# Patient Record
Sex: Male | Born: 1970
Health system: Southern US, Community
[De-identification: ages and names within clinical notes are randomized; demographics above are authoritative.]

## PROBLEM LIST (undated history)

## (undated) DIAGNOSIS — N201 Calculus of ureter: Secondary | ICD-10-CM

## (undated) DIAGNOSIS — G4733 Obstructive sleep apnea (adult) (pediatric): Secondary | ICD-10-CM

## (undated) DIAGNOSIS — N529 Male erectile dysfunction, unspecified: Secondary | ICD-10-CM

## (undated) DIAGNOSIS — N281 Cyst of kidney, acquired: Secondary | ICD-10-CM

## (undated) DIAGNOSIS — Z6835 Body mass index (BMI) 35.0-35.9, adult: Secondary | ICD-10-CM

## (undated) DIAGNOSIS — I10 Essential (primary) hypertension: Secondary | ICD-10-CM

## (undated) DIAGNOSIS — J449 Chronic obstructive pulmonary disease, unspecified: Secondary | ICD-10-CM

## (undated) HISTORY — DX: Body mass index (BMI) 35.0-35.9, adult: Z68.35

## (undated) HISTORY — DX: Essential (primary) hypertension: I10

## (undated) HISTORY — DX: Chronic obstructive pulmonary disease, unspecified: J44.9

## (undated) HISTORY — DX: Cyst of kidney, acquired: N28.1

---

## 2008-08-13 DIAGNOSIS — N281 Cyst of kidney, acquired: Secondary | ICD-10-CM

## 2008-08-13 HISTORY — DX: Cyst of kidney, acquired: N28.1

## 2009-04-21 ENCOUNTER — Ambulatory Visit: Payer: Self-pay | Admitting: Urology

## 2009-08-13 HISTORY — PX: UVULOPALATOPHARYNGOPLASTY: SHX827

## 2009-09-02 ENCOUNTER — Encounter: Payer: Self-pay | Admitting: Internal Medicine

## 2009-09-14 ENCOUNTER — Ambulatory Visit: Payer: Self-pay | Admitting: Internal Medicine

## 2009-09-14 DIAGNOSIS — J479 Bronchiectasis, uncomplicated: Secondary | ICD-10-CM | POA: Insufficient documentation

## 2009-09-14 DIAGNOSIS — I10 Essential (primary) hypertension: Secondary | ICD-10-CM

## 2009-09-14 HISTORY — DX: Essential (primary) hypertension: I10

## 2009-09-14 HISTORY — DX: Bronchiectasis, uncomplicated: J47.9

## 2009-09-19 ENCOUNTER — Telehealth (INDEPENDENT_AMBULATORY_CARE_PROVIDER_SITE_OTHER): Payer: Self-pay | Admitting: *Deleted

## 2009-09-20 LAB — CONVERTED CEMR LAB
Basophils Absolute: 0.1 10*3/uL (ref 0.0–0.1)
Eosinophils Absolute: 0.1 10*3/uL (ref 0.0–0.7)
Eosinophils Relative: 1.1 % (ref 0.0–5.0)
HCT: 44 % (ref 39.0–52.0)
Lymphs Abs: 1.3 10*3/uL (ref 0.7–4.0)
MCHC: 35 g/dL (ref 30.0–36.0)
MCV: 87.7 fL (ref 78.0–100.0)
Monocytes Absolute: 0.8 10*3/uL (ref 0.1–1.0)
Platelets: 235 10*3/uL (ref 150.0–400.0)
Pro B Natriuretic peptide (BNP): 6 pg/mL (ref 0.0–100.0)
RDW: 11.8 % (ref 11.5–14.6)

## 2010-09-12 NOTE — Progress Notes (Signed)
Summary: pt returned call re: labs  Phone Note Call from Patient   Caller: Patient Call For: wert Summary of Call: pt returned call from leslie. pt is aware that his labs were normal and no change in  therapy was needed. nothing else needed at this time per pt.  Initial call taken by: Tivis Ringer, CNA,  September 19, 2009 2:56 PM

## 2010-09-12 NOTE — Assessment & Plan Note (Signed)
Summary: Pulmonary consultation/ bronchiectasis with cough/sob    Visit Type:  Initial Consult Copy to:  Dr. Joetta Manners Primary Provider/Referring Provider:  Dr. Joetta Manners  CC:  Bronchiectasis.  History of Present Illness: 83 yowm never smoker with chronic sneezing every am x around 1995 not association with sinus infection.   September 14, 2009 cc new onset doe since 04/2009 progressively worse to point where across a parking lot some worse cough not very productive with choking / gasping/ gagging and vomiting even when trying to sleep so change from ace to azor since around January 1 but no help, then advair added no help.  maybe some better after albuterol..  Pt denies any significant sore throat, dysphagia, itching,  nasal congestion or excess secretions,  fever, chills, sweats, unintended wt loss, pleuritic or exertional cp, hempoptysis, change in activity tolerance  orthopnea pnd or leg swelling. Pt also denies any obvious fluctuation in symptoms with weather or environmental change or other alleviating or aggravating factors.       Current Medications (verified): 1)  Advair Diskus 100-50 Mcg/dose Aepb (Fluticasone-Salmeterol) .Marland Kitchen.. 1 Puff Two Times A Day 2)  Cartia Xt 180 Mg Xr24h-Cap (Diltiazem Hcl Coated Beads) .Marland Kitchen.. 1 Once Daily 3)  Ventolin Hfa 108 (90 Base) Mcg/act Aers (Albuterol Sulfate) .... 2 Puffs Every 4-6 Hours As Needed 4)  Azor 5-20 Mg Tabs (Amlodipine-Olmesartan) .Marland Kitchen.. 1 Once Daily  Allergies (verified): No Known Drug Allergies  Past History:  Past Medical History: Hypertension ? Asthma      - Change advair to symbiocrt 80 September 14, 2009      - [facility] September 14, 2009 > 50% effective  Family History: Heart dz- MGF Negative for respiratory diseases or atopy   Social History: Single Children Never smoker Uses smokeless tobacco  Social worker  Review of Systems       The patient complains of shortness of breath with activity, non-productive  cough, coughing up blood, abdominal pain, headaches, and sneezing.  The patient denies shortness of breath at rest, productive cough, chest pain, irregular heartbeats, acid heartburn, indigestion, loss of appetite, weight change, difficulty swallowing, sore throat, tooth/dental problems, nasal congestion/difficulty breathing through nose, itching, ear ache, anxiety, depression, hand/feet swelling, joint stiffness or pain, rash, change in color of mucus, and fever.    Vital Signs:  Patient profile:   40 year old male Height:      71 inches Weight:      250 pounds BMI:     34.99 O2 Sat:      97 % on Room air Temp:     98.0 degrees F oral Pulse rate:   110 / minute BP sitting:   146 / 88  (right arm)  Vitals Entered By: Vernie Murders (September 14, 2009 11:12 AM)  O2 Flow:  Room air  Serial Vital Signs/Assessments:  Comments: 11:58 AM Ambulatory Pulse Oximetry  Resting; HR__114___    02 Sat__95%ra___  Lap1 (185 feet)   HR__105___   02 Sat__91%ra___ Lap2 (185 feet)   HR__110___   02 Sat__95%ra___    Lap3 (185 feet)   HR__110___   02 Sat__95%ra___  ___Test Completed without Difficulty ___Test Stopped due to:   By: Vernie Murders    Physical Exam  Additional Exam:  amb pleasant  obese wm nad wt 250 September 14, 2009 HEENT: nl dentition, turbinates, and orophanx. Nl external ear canals without cough reflex NECK :  without JVD/Nodes/TM/ nl carotid upstrokes bilaterally LUNGS: no  acc muscle use, clear to A and P bilaterally without cough on insp or exp maneuvers CV:  RRR  no s3 or murmur or increase in P2, no edema  ABD:  soft and nontender with nl excursion in the supine position. No bruits or organomegaly, bowel sounds nl MS:  warm without deformities, calf tenderness, cyanosis or clubbing SKIN: warm and dry without lesions   NEURO:  alert, approp, no deficits   White Cell Count          7.6 K/uL                    4.5-10.5   Red Cell Count            5.01 Mil/uL                  4.22-5.81   Hemoglobin                15.4 g/dL                   52.8-41.3   Hematocrit                44.0 %                      39.0-52.0   MCV                       87.7 fl                     78.0-100.0   MCHC                      35.0 g/dL                   24.4-01.0   RDW                       11.8 %                      11.5-14.6   Platelet Count            235.0 K/uL                  150.0-400.0   Neutrophil %              70.0 %                      43.0-77.0   Lymphocyte %              17.4 %                      12.0-46.0   Monocyte %                10.7 %                      3.0-12.0   Eosinophils%              1.1 %                       0.0-5.0   Basophils %               0.8 %  0.0-3.0   Neutrophill Absolute      5.3 K/uL                    1.4-7.7   Lymphocyte Absolute       1.3 K/uL                    0.7-4.0   Monocyte Absolute         0.8 K/uL                    0.1-1.0  Eosinophils, Absolute                             0.1 K/uL                    0.0-0.7   Basophils Absolute        0.1 K/uL                    0.0-0.1  Tests: (2) B-Type Natiuretic Peptide (BNPR)  B-Type Natriuetic Peptide                             6.0 pg/mL                   0.0-100.0   CT of Chest  Procedure date:  09/02/2009  Findings:      mild bronchiectatic changes RLL  Impression & Recommendations:  Problem # 1:  DYSPNEA (ICD-786.09)  Upper airway cough syndrome, so named because it's frequently impossible to sort out how much is LPR vs CR/sinusitis with freq throat clearing generating secondary extra esophageal GERD from wide swings in gastric pressure that occur with throat clearing, promoting self use of mint and menthol lozenges that reduce the lower esophageal sphincter tone and exacerbate the problem further.  These symptoms are easily confused with asthma/copd by even experienced pulmonogists because they overlap so much. These are the same pts who not  infrequently have failed to tolerate ace inhibitors,  dry powder inhalers or biphosphonates or report having reflux symptoms that don't respond to standard doses of PPI .  This fits with him because his problem started on ace and not better on DPI (advair) so change to symbicort hfa   I spent extra time with the patient today explaining optimal mdi  technique.  This improved from  25-50% and needs more work   The following medications were removed from the medication list:    Advair Diskus 100-50 Mcg/dose Aepb (Fluticasone-salmeterol) .Marland Kitchen... 1 puff two times a day    Ventolin Hfa 108 (90 Base) Mcg/act Aers (Albuterol sulfate) .Marland Kitchen... 2 puffs every 4-6 hours as needed His updated medication list for this problem includes:    Symbicort 80-4.5 Mcg/act Aero (Budesonide-formoterol fumarate) .Marland Kitchen... 2 puffs first thing  in am and 2 puffs again in pm about 12 hours later  Orders: Consultation Level V (99245) Pulse Oximetry, Ambulatory (27782) TLB-CBC Platelet - w/Differential (85025-CBCD) TLB-BNP (B-Natriuretic Peptide) (83880-BNPR)  Problem # 2:  BRONCHIECTASIS W/O ACUTE EXACERBATION (ICD-494.0) Ct reviewed - this is very limited and unlikely to explain any of his symptoms but is due  either due to remote infection or an aspiration event that occured related to GERD/LPR which is the leading suspect here for his symptoms.  Rec: conservative rx  Problem # 3:  HYPERTENSION (ICD-401.9) BNP <<  100 so this is not chf in any form  His updated medication list for this problem includes:    Cartia Xt 180 Mg Xr24h-cap (Diltiazem hcl coated beads) .Marland Kitchen... 1 once daily    Azor 5-20 Mg Tabs (Amlodipine-olmesartan) .Marland Kitchen... 1 once daily  Problem # 4:  COUGH (ICD-786.2)  The most common causes of chronic cough in immunocompetent adults include: upper airway cough syndrome (UACS), previously referred to as postnasal drip syndrome,  caused by variety of rhinosinus conditions; (2) asthma; (3) GERD; (4) chronic bronchitis  from cigarette smoking or other inhaled environmental irritants; (5) nonasthmatic eosinophilic bronchitis; and (6) bronchiectasis. These conditions, singly or in combination, have accounted for up to 94% of the causes of chronic cough in prospective studies.  Of the three most common causes of chronic cough, only one (GERD) can actually cause the other two ( Asthma/ chronic rhinitis) and perpetuate the cylce of cough inducing airway trauma, inflammation, heightened sensitivity to reflux which is prompted by the cough itself via a cyclical mechanism.  This may partially respond to steroids and look like asthma  thus his reported response to albuterol.  Will rx gerd aggressively but in meantime add symbicort 80 and taper it off if have good control of symptoms with rx of GERD   Orders: Consultation Level V (28413)  Medications Added to Medication List This Visit: 1)  Advair Diskus 100-50 Mcg/dose Aepb (Fluticasone-salmeterol) .Marland Kitchen.. 1 puff two times a day 2)  Cartia Xt 180 Mg Xr24h-cap (Diltiazem hcl coated beads) .Marland Kitchen.. 1 once daily 3)  Ventolin Hfa 108 (90 Base) Mcg/act Aers (Albuterol sulfate) .... 2 puffs every 4-6 hours as needed 4)  Azor 5-20 Mg Tabs (Amlodipine-olmesartan) .Marland Kitchen.. 1 once daily 5)  Symbicort 80-4.5 Mcg/act Aero (Budesonide-formoterol fumarate) .... 2 puffs first thing  in am and 2 puffs again in pm about 12 hours later  Patient Instructions: 1)  stop advair 2)  only use ventolin in emergency situations 3)  start symbicort 80 2 puffs first thing  in am and 2 puffs again in pm about 12 hours later 4)  Work on inhaler technique:  relax and blow all the way out then take a nice smooth deep breath back in, triggering the inhaler at same time you start breathing in  5)  GERD (REFLUX)  is a common cause of respiratory symptoms. It commonly presents without heartburn and can be treated with medication, but also with lifestyle changes including avoidance of late meals, excessive alcohol,  tobacco cessation, and avoid fatty foods, chocolate, peppermint, colas, red wine, and acidic juices such as orange juice. NO MINT OR MENTHOL PRODUCTS SO NO COUGH DROPS  6)  USE SUGARLESS CANDY INSTEAD (jolley ranchers)  7)  NO OIL BASED VITAMINS  8)  start dexilant 60 Take  one 30-60 min before first meal of the day and pepcid otc 20 mg one at bedtime 9)  Please schedule a follow-up appointment in 4 weeks, sooner if needed

## 2011-05-29 ENCOUNTER — Ambulatory Visit: Payer: Self-pay | Admitting: Internal Medicine

## 2011-06-08 ENCOUNTER — Ambulatory Visit (INDEPENDENT_AMBULATORY_CARE_PROVIDER_SITE_OTHER): Payer: BC Managed Care – PPO | Admitting: Internal Medicine

## 2011-06-08 ENCOUNTER — Encounter: Payer: Self-pay | Admitting: Internal Medicine

## 2011-06-08 DIAGNOSIS — Q619 Cystic kidney disease, unspecified: Secondary | ICD-10-CM

## 2011-06-08 DIAGNOSIS — J449 Chronic obstructive pulmonary disease, unspecified: Secondary | ICD-10-CM

## 2011-06-08 DIAGNOSIS — I10 Essential (primary) hypertension: Secondary | ICD-10-CM

## 2011-06-08 DIAGNOSIS — G4733 Obstructive sleep apnea (adult) (pediatric): Secondary | ICD-10-CM

## 2011-06-08 DIAGNOSIS — N281 Cyst of kidney, acquired: Secondary | ICD-10-CM

## 2011-06-08 DIAGNOSIS — E669 Obesity, unspecified: Secondary | ICD-10-CM

## 2011-06-08 DIAGNOSIS — Z6835 Body mass index (BMI) 35.0-35.9, adult: Secondary | ICD-10-CM | POA: Insufficient documentation

## 2011-06-08 MED ORDER — ALBUTEROL SULFATE HFA 108 (90 BASE) MCG/ACT IN AERS: 2.0000 | INHALATION_SPRAY | RESPIRATORY_TRACT | Status: DC | PRN

## 2011-06-08 MED ORDER — HYDROCHLOROTHIAZIDE 25 MG PO TABS: 25.0000 mg | ORAL_TABLET | Freq: Every day | ORAL | Status: DC

## 2011-06-08 MED ORDER — BUDESONIDE-FORMOTEROL FUMARATE 160-4.5 MCG/ACT IN AERO: 2.0000 | INHALATION_SPRAY | Freq: Two times a day (BID) | RESPIRATORY_TRACT | Status: DC

## 2011-06-08 MED ORDER — AMLODIPINE BESYLATE-VALSARTAN 10-320 MG PO TABS: 1.0000 | ORAL_TABLET | Freq: Every day | ORAL | Status: DC

## 2011-06-08 NOTE — Patient Instructions (Signed)
I recommend you use the Atkins and EAS carb control products to lose weight.  Eat every 3 hours,  With small 150 cal or less low carb snacks and eat a lean and green dinner.  Make it a goal to walk for 20 minutes a minimum of 5 days per week.

## 2011-06-10 ENCOUNTER — Encounter: Payer: Self-pay | Admitting: Internal Medicine

## 2011-06-10 NOTE — Assessment & Plan Note (Signed)
Found during Urology evaluation for hematuria, with fh of bladder CA in father.  Will review CT and determine need for followup.

## 2011-06-10 NOTE — Assessment & Plan Note (Signed)
Resuming hctz and continuing Exforge

## 2011-06-10 NOTE — Progress Notes (Signed)
  Subjective:    Patient ID: Timothy Small, male    DOB: [birth year], 40 y.o.   MRN: [id number]  HPI  Timothy Small is a 40 yr old white male with untreated OSA , allergic rhinitis, hypertension, COPD and renal cysts who presents for establishment of care. He has had prior ENT surgery which did not relieve hs OSA but he has not tolerated prior CPAP triaqls and has not worn a mask in over one year.  He has obesity but does not exercise and has gained weight despite trying to reduce his portion sizes.  He skips meals frequently.    Past Medical History  Diagnosis Date  . Sleep apnea, obstructive 2011    by sleep study, s/p surgery and CPAP   . Obesity (BMI 30-39.9)   . Hypertension   . COPD (chronic obstructive pulmonary disease)     confirmed by PFTS  . Renal cyst 2010    large,     No current outpatient prescriptions on file prior to visit.    Review of Systems  Constitutional: Negative for fever, chills, diaphoresis, activity change, appetite change, fatigue and unexpected weight change.  HENT: Negative for hearing loss, ear pain, nosebleeds, congestion, sore throat, facial swelling, rhinorrhea, sneezing, drooling, mouth sores, trouble swallowing, neck pain, neck stiffness, dental problem, voice change, postnasal drip, sinus pressure, tinnitus and ear discharge.   Eyes: Negative for photophobia, pain, discharge, redness, itching and visual disturbance.  Respiratory: Negative for apnea, cough, choking, chest tightness, shortness of breath, wheezing and stridor.   Cardiovascular: Positive for leg swelling. Negative for chest pain and palpitations.  Gastrointestinal: Negative for nausea, vomiting, abdominal pain, diarrhea, constipation, blood in stool, abdominal distention, anal bleeding and rectal pain.  Genitourinary: Negative for dysuria, urgency, frequency, hematuria, flank pain, decreased urine volume, scrotal swelling, difficulty urinating and testicular pain.  Musculoskeletal:  Negative for myalgias, back pain, joint swelling, arthralgias and gait problem.  Skin: Negative for color change, rash and wound.  Neurological: Negative for dizziness, tremors, seizures, syncope, speech difficulty, weakness, light-headedness, numbness and headaches.  Psychiatric/Behavioral: Negative for suicidal ideas, hallucinations, behavioral problems, confusion, sleep disturbance, dysphoric mood, decreased concentration and agitation. The patient is not nervous/anxious.        Objective:   Physical Exam  Constitutional: He is oriented to person, place, and time.    HENT:  Head: Normocephalic and atraumatic.  Mouth/Throat: Oropharynx is clear and moist.  Eyes: Conjunctivae and EOM are normal.  Neck: Trachea normal and normal range of motion. Neck supple. No thyromegaly present.  Cardiovascular: Normal rate, regular rhythm and normal heart sounds.   Pulmonary/Chest: Effort normal and breath sounds normal. He has no wheezes. He has no rales.  Abdominal: Soft. Bowel sounds are normal. He exhibits no mass. There is no tenderness. There is no rebound.  Musculoskeletal: Normal range of motion. He exhibits edema.  Neurological: He is alert and oriented to person, place, and time.  Skin: Skin is warm and dry.  Psychiatric: He has a normal mood and affect.          Assessment & Plan:

## 2011-06-10 NOTE — Assessment & Plan Note (Signed)
Currently untreated due to patient intolerance to CPAP.  Records requested of his last study., may benefit from a trial of an autotitrating mask and certainly needs wt loss of a minimum of 10% of body weight

## 2011-06-10 NOTE — Assessment & Plan Note (Signed)
Spent 15 minutes discussing his approach to weight loss and why it is not achieving his goals.  Recommended aerobic exercise and Medifast  Diet .

## 2012-03-10 ENCOUNTER — Other Ambulatory Visit: Payer: Self-pay | Admitting: Internal Medicine

## 2012-03-10 DIAGNOSIS — J449 Chronic obstructive pulmonary disease, unspecified: Secondary | ICD-10-CM

## 2012-03-10 MED ORDER — BUDESONIDE-FORMOTEROL FUMARATE 160-4.5 MCG/ACT IN AERO: 2.0000 | INHALATION_SPRAY | Freq: Two times a day (BID) | RESPIRATORY_TRACT | Status: DC

## 2012-05-23 ENCOUNTER — Other Ambulatory Visit: Payer: Self-pay | Admitting: Internal Medicine

## 2012-07-11 ENCOUNTER — Other Ambulatory Visit: Payer: Self-pay

## 2012-07-11 DIAGNOSIS — I10 Essential (primary) hypertension: Secondary | ICD-10-CM

## 2012-07-11 MED ORDER — AMLODIPINE BESYLATE-VALSARTAN 10-320 MG PO TABS: 1.0000 | ORAL_TABLET | Freq: Every day | ORAL | Status: DC

## 2012-07-11 MED ORDER — HYDROCHLOROTHIAZIDE 25 MG PO TABS: 25.0000 mg | ORAL_TABLET | Freq: Every day | ORAL | Status: DC

## 2012-07-11 NOTE — Telephone Encounter (Addendum)
Refill request for Exforge 10-320 mg tablet and Hydrochlorothiazide 25 mg  No OV in a year. Ok to refill?

## 2012-10-27 ENCOUNTER — Telehealth: Payer: Self-pay | Admitting: *Deleted

## 2012-10-27 NOTE — Telephone Encounter (Signed)
Called [phone] for prior authorization for the Exforge 10-320 mg form is being faxed over now

## 2012-10-28 ENCOUNTER — Telehealth: Payer: Self-pay | Admitting: *Deleted

## 2012-10-28 MED ORDER — VALSARTAN 320 MG PO TABS: 320.0000 mg | ORAL_TABLET | Freq: Every day | ORAL | Status: DC

## 2012-10-28 MED ORDER — AMLODIPINE BESYLATE 10 MG PO TABS: 10.0000 mg | ORAL_TABLET | Freq: Every day | ORAL | Status: DC

## 2012-10-28 NOTE — Telephone Encounter (Signed)
I have not received a refill request since nov at which time 103 with 3 refills ordered. This is a generic medication so I do not know why PA is required but that will take up to a week, so  I recommend he take generic amlodipine and generic valsartan in the interim  And have sent rx's to [facility]

## 2012-10-28 NOTE — Telephone Encounter (Signed)
Pt is completely out of Exforge. Medication requires prior authorization. Pharmacy has sent of request but has not received a response. Please call patient when prior authorization has been completed. Please call [phone] Art Buff (pt's wife).

## 2012-10-29 NOTE — Telephone Encounter (Signed)
Pt notified of the results.  

## 2012-10-30 ENCOUNTER — Telehealth: Payer: Self-pay | Admitting: Internal Medicine

## 2012-10-30 DIAGNOSIS — I10 Essential (primary) hypertension: Secondary | ICD-10-CM

## 2012-10-30 NOTE — Telephone Encounter (Signed)
I will not fill out the PA for this patient's medication until he has labs done,  He has not been seen since Oct 2012 and he is overdue by a year!!!! He needs fasting lipids and CMET

## 2012-10-31 NOTE — Telephone Encounter (Signed)
Called pt could not reach. Can you call to schedule him for these labs: Fasting lipids and CMET. Thanks!

## 2012-10-31 NOTE — Telephone Encounter (Signed)
Lab appt 3/25 at 8:30 a.m. for fasting lipids and CMET.  Pt aware.

## 2012-11-04 ENCOUNTER — Other Ambulatory Visit (INDEPENDENT_AMBULATORY_CARE_PROVIDER_SITE_OTHER): Payer: BC Managed Care – PPO

## 2012-11-04 DIAGNOSIS — I10 Essential (primary) hypertension: Secondary | ICD-10-CM

## 2012-11-04 LAB — COMPREHENSIVE METABOLIC PANEL
Albumin: 4.4 g/dL (ref 3.5–5.2)
Alkaline Phosphatase: 64 U/L (ref 39–117)
CO2: 28 mEq/L (ref 19–32)
Glucose, Bld: 106 mg/dL — ABNORMAL HIGH (ref 70–99)
Potassium: 3.9 mEq/L (ref 3.5–5.1)
Sodium: 139 mEq/L (ref 135–145)
Total Protein: 7.6 g/dL (ref 6.0–8.3)

## 2012-11-04 LAB — LIPID PANEL
Cholesterol: 214 mg/dL — ABNORMAL HIGH (ref 0–200)
Total CHOL/HDL Ratio: 6

## 2012-11-07 ENCOUNTER — Telehealth: Payer: Self-pay | Admitting: Internal Medicine

## 2012-11-07 ENCOUNTER — Other Ambulatory Visit: Payer: Self-pay | Admitting: Internal Medicine

## 2012-11-07 NOTE — Telephone Encounter (Signed)
Called pt and LMOVM to return call.  °

## 2012-11-07 NOTE — Telephone Encounter (Signed)
The prior authorization for Exforge requires that I state that he has failed therapy with other medications for management of bp.   I do not have this info.  Does he want to stick with the amlodiine and valsartan that I called in to [facility] last week? If not he needs to suppley with with the missing info

## 2012-11-10 NOTE — Telephone Encounter (Signed)
Pt called back and stated he would like to stay with the valsartan and amlodipine.

## 2012-12-09 ENCOUNTER — Other Ambulatory Visit: Payer: Self-pay | Admitting: Internal Medicine

## 2013-06-28 ENCOUNTER — Other Ambulatory Visit: Payer: Self-pay | Admitting: Internal Medicine

## 2013-07-01 ENCOUNTER — Telehealth: Payer: Self-pay | Admitting: Internal Medicine

## 2013-07-01 ENCOUNTER — Other Ambulatory Visit: Payer: Self-pay | Admitting: Internal Medicine

## 2013-07-01 DIAGNOSIS — J449 Chronic obstructive pulmonary disease, unspecified: Secondary | ICD-10-CM

## 2013-07-01 MED ORDER — BUDESONIDE-FORMOTEROL FUMARATE 160-4.5 MCG/ACT IN AERO: 2.0000 | INHALATION_SPRAY | Freq: Two times a day (BID) | RESPIRATORY_TRACT | Status: DC

## 2013-07-01 NOTE — Telephone Encounter (Signed)
I will refill for one more time to give him time to find a doctor closer to home to be his PCP because he never followed up with me since 2012

## 2013-07-01 NOTE — Telephone Encounter (Signed)
Patient notified.,

## 2013-07-01 NOTE — Telephone Encounter (Signed)
Telephone note has already been sent to Dr. Darrick Huntsman regarding this refill

## 2013-07-01 NOTE — Telephone Encounter (Signed)
Calling for status of refill on Symbicort.

## 2013-07-01 NOTE — Telephone Encounter (Signed)
Patient states he lives three hours away and cannot come in for visit, if has to be seen he stated I will find a physician closer by. Please advise.

## 2013-08-17 ENCOUNTER — Other Ambulatory Visit: Payer: Self-pay | Admitting: Internal Medicine

## 2013-09-23 ENCOUNTER — Ambulatory Visit (INDEPENDENT_AMBULATORY_CARE_PROVIDER_SITE_OTHER): Payer: BC Managed Care – PPO | Admitting: Internal Medicine

## 2013-09-23 ENCOUNTER — Encounter: Payer: Self-pay | Admitting: Internal Medicine

## 2013-09-23 VITALS — BP 166/98 | HR 81 | Temp 98.7°F | Resp 18 | Wt 256.0 lb

## 2013-09-23 DIAGNOSIS — R5383 Other fatigue: Secondary | ICD-10-CM

## 2013-09-23 DIAGNOSIS — R5381 Other malaise: Secondary | ICD-10-CM

## 2013-09-23 DIAGNOSIS — E669 Obesity, unspecified: Secondary | ICD-10-CM

## 2013-09-23 DIAGNOSIS — I10 Essential (primary) hypertension: Secondary | ICD-10-CM

## 2013-09-23 DIAGNOSIS — E785 Hyperlipidemia, unspecified: Secondary | ICD-10-CM

## 2013-09-23 DIAGNOSIS — F5105 Insomnia due to other mental disorder: Secondary | ICD-10-CM

## 2013-09-23 DIAGNOSIS — G4733 Obstructive sleep apnea (adult) (pediatric): Secondary | ICD-10-CM

## 2013-09-23 DIAGNOSIS — F489 Nonpsychotic mental disorder, unspecified: Secondary | ICD-10-CM

## 2013-09-23 DIAGNOSIS — F409 Phobic anxiety disorder, unspecified: Secondary | ICD-10-CM

## 2013-09-23 MED ORDER — AMLODIPINE BESYLATE 10 MG PO TABS
ORAL_TABLET | ORAL | Status: DC
Start: 2013-09-23 — End: 2014-06-16

## 2013-09-23 MED ORDER — ALPRAZOLAM 0.5 MG PO TABS: 0.5000 mg | ORAL_TABLET | Freq: Every evening | ORAL | Status: DC | PRN

## 2013-09-23 MED ORDER — HYDROCHLOROTHIAZIDE 25 MG PO TABS
25.0000 mg | ORAL_TABLET | Freq: Every day | ORAL | Status: DC
Start: 2013-09-23 — End: 2014-11-10

## 2013-09-23 MED ORDER — VALSARTAN 320 MG PO TABS: 320.0000 mg | ORAL_TABLET | Freq: Every day | ORAL | Status: DC

## 2013-09-23 NOTE — Assessment & Plan Note (Signed)
Wearing CPAP nightly but only averaging 4 hours per night  Can't stay asleep.  Has never tried any medications

## 2013-09-23 NOTE — Patient Instructions (Signed)
Your blood pressure is not at goal (130/80) so you need to start talking valsartan.  Continue the amlodipine and hctz as well Have your blood pressure rechecked in one week and let me know the results.  I am prescribing a low dose of anxiety medication to try to help you sleep better,  Alprazolam.  Do not share with others; it is a controlled substance.  Do not take if you have been drinking alcohol in excess (2 or less beers with dinner is ok)  I want you to lose 20 lbs over the next 6 months.  This will lower your blood pressure and your cholesterol.  This is  One version of a  "Low GI"  Diet: allow you to lose 4 to 8  lbs  per month if you follow it carefully.  Your goal with exercise is a minimum of 30 minutes of aerobic exercise 5 days per week (Walking does not count once it becomes easy!)    All of the foods can be found at grocery stores and in bulk at [employer].  The Atkins protein bars and shakes are available in more varieties at [employer], [employer] and [place].     7 AM Breakfast:  Choose from the following:  Low carbohydrate Protein  Shakes (I recommend the EAS AdvantEdge "Carb Control" shakes  Or the low carb shakes by Atkins.    2.5 carbs   Arnold's "Sandwhich Thin"toasted  w/ peanut butter (no jelly: about 20 net carbs  "Bagel Thin" with cream cheese and salmon: about 20 carbs   a scrambled egg/bacon/cheese burrito made with [place]'s "carb balance" whole wheat tortilla  (about 10 net carbs )   Avoid cereal and bananas, oatmeal and cream of wheat and grits. They are loaded with carbohydrates!   10 AM: high protein snack  Protein bar by Atkins (the snack size, under 200 cal, usually < 6 net carbs).    A stick of cheese:  Around 1 carb,  100 cal     Dannon Light n Fit [place] Yogurt  (80 cal, 8 carbs)  Other so called "protein bars" and Greek yogurts tend to be loaded with carbohydrates.  Remember, in food advertising, the word "energy" is synonymous for "  carbohydrate."  Lunch:   A Sandwich using the bread choices listed, Can use any  Eggs,  lunchmeat, grilled meat or canned tuna), avocado, regular mayo/mustard  and cheese.  A Salad using blue cheese, ranch,  Goddess or vinagrette,  No croutons or "confetti" and no "candied nuts" but regular nuts OK.   No pretzels or chips.  Pickles and miniature sweet peppers are a good low carb alternative that provide a "crunch"  The bread is the only source of carbohydrate in a sandwich and  can be decreased by trying some of these alternatives to traditional loaf bread  Joseph's makes a pita bread and a flat bread that are 50 cal and 4 net carbs available at [employer] and [place].  This can be toasted to use with hummous as well  Toufayan makes a low carb flatbread that's 100 cal and 9 net carbs available at [employer] and [employer]J's makes 2 sizes of  Low carb whole wheat tortilla  (The large one is 210 cal and 6 net carbs) Avoid "Low fat dressings, as well as Barry Brunner and [place] dressings They are loaded with sugar!   3 PM/ Mid day  Snack:  Consider  1 ounce of  almonds,  walnuts, pistachios, pecans, peanuts,  Macadamia nuts or a nut medley.  Avoid "granola"; the dried cranberries and raisins are loaded with carbohydrates. Mixed nuts as long as there are no raisins,  cranberries or dried fruit.     6 PM  Dinner:     Meat/fowl/fish with a green salad, and either broccoli, cauliflower, green beans, spinach, brussel sprouts or  Lima beans. DO NOT BREAD THE PROTEIN!!      There is a low carb pasta by Dreamfield's that is acceptable and tastes great: only 5 digestible carbs/serving.( All grocery stores but BJs carry it )  Try Hurley Cisco Angelo's chicken piccata or chicken or eggplant parm over low carb pasta.(Lowes and BJs)   Marjory Lies Sanchez's "Carnitas" (pulled pork, no sauce,  0 carbs) or his beef pot roast to make a dinner burrito (at [employer]J's)  Pesto over low carb pasta (bj's sells a good quality pesto in the  center refrigerated section of the deli   Whole wheat pasta is still full of digestible carbs and  Not as low in glycemic index as Dreamfield's.   Brown rice is still rice,  So skip the rice and noodles if you eat [place] or [place] (or at least limit to 1/2 cup)  9 PM snack :   Breyer's "low carb" fudgsicle or  ice cream bar (Carb Smart line), or  Weight Watcher's ice cream bar , or another "no sugar added" ice cream;  a serving of fresh berries/cherries with whipped cream   Cheese or DANNON'S LlG[place] YOGURT  Avoid bananas, pineapple, grapes  and watermelon on a regular basis because they are high in sugar.  THINK OF THEM AS DESSERT  Remember that snack Substitutions should be less than 10 NET carbs per serving and meals < 20 carbs. Remember to subtract fiber grams to get the "net carbs."  Hypertension As your heart beats, it forces blood through your arteries. This force is your blood pressure. If the pressure is too high, it is called hypertension (HTN) or high blood pressure. HTN is dangerous because you may have it and not know it. High blood pressure may mean that your heart has to work harder to pump blood. Your arteries may be narrow or stiff. The extra work puts you at risk for heart disease, stroke, and other problems.  Blood pressure consists of two numbers, a higher number over a lower, 110/72, for example. It is stated as "110 over 72." The ideal is below 120 for the top number (systolic) and under 80 for the bottom (diastolic). Write down your blood pressure today. You should pay close attention to your blood pressure if you have certain conditions such as:  Heart failure.  Prior heart attack.  Diabetes  Chronic kidney disease.  Prior stroke.  Multiple risk factors for heart disease. To see if you have HTN, your blood pressure should be measured while you are seated with your arm held at the level of the heart. It should be measured at least twice. A one-time  elevated blood pressure reading (especially in the Emergency Department) does not mean that you need treatment. There may be conditions in which the blood pressure is different between your right and left arms. It is important to see your caregiver soon for a recheck. Most people have essential hypertension which means that there is not a specific cause. This type of high blood pressure may be lowered by changing lifestyle factors such as:  Stress.  Smoking.  Lack  of exercise.  Excessive weight.  Drug/tobacco/alcohol use.  Eating less salt. Most people do not have symptoms from high blood pressure until it has caused damage to the body. Effective treatment can often prevent, delay or reduce that damage. TREATMENT  When a cause has been identified, treatment for high blood pressure is directed at the cause. There are a large number of medications to treat HTN. These fall into several categories, and your caregiver will help you select the medicines that are best for you. Medications may have side effects. You should review side effects with your caregiver. If your blood pressure stays high after you have made lifestyle changes or started on medicines,   Your medication(s) may need to be changed.  Other problems may need to be addressed.  Be certain you understand your prescriptions, and know how and when to take your medicine.  Be sure to follow up with your caregiver within the time frame advised (usually within two weeks) to have your blood pressure rechecked and to review your medications.  If you are taking more than one medicine to lower your blood pressure, make sure you know how and at what times they should be taken. Taking two medicines at the same time can result in blood pressure that is too low. SEEK IMMEDIATE MEDICAL CARE IF:  You develop a severe headache, blurred or changing vision, or confusion.  You have unusual weakness or numbness, or a faint feeling.  You have  severe chest or abdominal pain, vomiting, or breathing problems. MAKE SURE YOU:   Understand these instructions.  Will watch your condition.  Will get help right away if you are not doing well or get worse. Document Released: 07/30/2005 Document Revised: 10/22/2011 Document Reviewed: 03/19/2008 [facility] Patient Information 2014 [place].

## 2013-09-23 NOTE — Progress Notes (Signed)
Pre-visit discussion using our clinic review tool. No additional management support is needed unless otherwise documented below in the visit note.Patient states he never received information about Valsartan.

## 2013-09-24 ENCOUNTER — Telehealth: Payer: Self-pay | Admitting: Internal Medicine

## 2013-09-24 LAB — COMPREHENSIVE METABOLIC PANEL
ALT: 44 U/L (ref 0–53)
AST: 29 U/L (ref 0–37)
Albumin: 4.3 g/dL (ref 3.5–5.2)
Alkaline Phosphatase: 65 U/L (ref 39–117)
BUN: 12 mg/dL (ref 6–23)
CALCIUM: 9 mg/dL (ref 8.4–10.5)
CHLORIDE: 105 meq/L (ref 96–112)
CO2: 26 mEq/L (ref 19–32)
CREATININE: 1.1 mg/dL (ref 0.4–1.5)
GFR: 81.2 mL/min (ref >60.00)
Glucose, Bld: 144 mg/dL — ABNORMAL HIGH (ref 70–99)
Potassium: 3.4 mEq/L — ABNORMAL LOW (ref 3.5–5.1)
Sodium: 141 mEq/L (ref 135–145)
Total Bilirubin: 0.6 mg/dL (ref 0.3–1.2)
Total Protein: 7.3 g/dL (ref 6.0–8.3)

## 2013-09-24 LAB — CBC WITH DIFFERENTIAL/PLATELET
BASOS ABS: 0.1 10*3/uL (ref 0.0–0.1)
Basophils Relative: 0.8 % (ref 0.0–3.0)
Eosinophils Absolute: 0.1 10*3/uL (ref 0.0–0.7)
Eosinophils Relative: 1.3 % (ref 0.0–5.0)
HEMATOCRIT: 47.5 % (ref 39.0–52.0)
Hemoglobin: 15.9 g/dL (ref 13.0–17.0)
Lymphocytes Relative: 21.3 % (ref 12.0–46.0)
Lymphs Abs: 1.7 10*3/uL (ref 0.7–4.0)
MCHC: 33.6 g/dL (ref 30.0–36.0)
MCV: 89.3 fl (ref 78.0–100.0)
MONO ABS: 0.5 10*3/uL (ref 0.1–1.0)
MONOS PCT: 6.2 % (ref 3.0–12.0)
Neutro Abs: 5.5 10*3/uL (ref 1.4–7.7)
Neutrophils Relative %: 70.4 % (ref 43.0–77.0)
PLATELETS: 238 10*3/uL (ref 150.0–400.0)
RBC: 5.32 Mil/uL (ref 4.22–5.81)
RDW: 13.9 % (ref 11.5–14.6)
WBC: 7.8 10*3/uL (ref 4.5–10.5)

## 2013-09-24 LAB — LDL CHOLESTEROL, DIRECT: LDL DIRECT: 144.2 mg/dL

## 2013-09-24 LAB — TSH: TSH: 2.62 u[IU]/mL (ref 0.35–5.50)

## 2013-09-24 NOTE — Assessment & Plan Note (Signed)
Trial of alprazolam low dose    

## 2013-09-24 NOTE — Assessment & Plan Note (Addendum)
Elevated today.  Adding valsartan to amlodipine and hctz.   Lab Results  Component Value Date   CREATININE 1.1 09/23/2013   Lab Results  Component Value Date   NA 141 09/23/2013   K 3.4* 09/23/2013   CL 105 09/23/2013   CO2 26 09/23/2013

## 2013-09-24 NOTE — Telephone Encounter (Signed)
Relevant patient education assigned to patient using Emmi. ° °

## 2013-09-24 NOTE — Assessment & Plan Note (Signed)
I have addressed  BMI and recommended a low glycemic index diet utilizing smaller more frequent meals to increase metabolism.  I have also recommended that patient start exercising with a goal of 30 minutes of aerobic exercise a minimum of 5 days per week. Screening for lipid disorders, thyroid and diabetes

## 2013-09-24 NOTE — Progress Notes (Signed)
Patient ID: Timothy Small, male   DOB: 20-Jan-1971, 43 y.o.   MRN: [id number]   Patient Active Problem List   Diagnosis Date Noted  . Insomnia due to anxiety and fear 09/24/2013  . Sleep apnea, obstructive   . Obesity (BMI 30-39.9)   . Hypertension   . Renal cyst   . HYPERTENSION 09/14/2009  . BRONCHIECTASIS W/O ACUTE EXACERBATION 09/14/2009  . DYSPNEA 09/14/2009  . COUGH 09/14/2009    Subjective:  CC:   Chief Complaint  Patient presents with  . Follow-up    Medication refills    HPI:   Timothy Small is a 43 y.o. male who presents for follow up on hypertension and obesity.  Has been lost to follow up due to moving away from area and now returning home.  Past Medical History  Diagnosis Date  . Sleep apnea, obstructive 2011    by sleep study, s/p surgery and CPAP   . Obesity (BMI 30-39.9)   . Hypertension   . COPD (chronic obstructive pulmonary disease)     confirmed by PFTS  . Renal cyst 2010    large,     Past Surgical History  Procedure Laterality Date  . Uvulopalatopharyngoplasty, tonsillectomy and septoplasty  2011     [facility] for OSA       The following portions of the patient's history were reviewed and updated as appropriate: Allergies, current medications, and problem list.    Review of Systems:   Patient denies headache, fevers, malaise, unintentional weight loss, skin rash, eye pain, sinus congestion and sinus pain, sore throat, dysphagia,  hemoptysis , cough, dyspnea, wheezing, chest pain, palpitations, orthopnea, edema, abdominal pain, nausea, melena, diarrhea, constipation, flank pain, dysuria, hematuria, urinary  Frequency, nocturia, numbness, tingling, seizures,  Focal weakness, Loss of consciousness,  Tremor, insomnia, depression, anxiety, and suicidal ideation.     History   Social History  . Marital Status: Married    Spouse Name: [name]    Number of Children: 2  . Years of Education: N/A   Occupational History  .      Social History Main Topics  . Smoking status: Never Smoker   . Smokeless tobacco: Former Systems developer    Types: Chew    Quit date: 05/09/2011  . Alcohol Use: 2.4 oz/week    4 Cans of beer per week  . Drug Use: No  . Sexual Activity: Not on file   Other Topics Concern  . Not on file   Social History Narrative  . No narrative on file    Objective:  Filed Vitals:   09/23/13 1528  BP: 166/98  Pulse: 81  Temp: 98.7 F (37.1 C)  Resp: 18     General appearance: alert, cooperative and appears stated age Ears: normal TM's and external ear canals both ears Throat: lips, mucosa, and tongue normal; teeth and gums normal Neck: no adenopathy, no carotid bruit, supple, symmetrical, trachea midline and thyroid not enlarged, symmetric, no tenderness/mass/nodules Back: symmetric, no curvature. ROM normal. No CVA tenderness. Lungs: clear to auscultation bilaterally Heart: regular rate and rhythm, S1, S2 normal, no murmur, click, rub or gallop Abdomen: soft, non-tender; bowel sounds normal; no masses,  no organomegaly Pulses: 2+ and symmetric Skin: Skin color, texture, turgor normal. No rashes or lesions Lymph nodes: Cervical, supraclavicular, and axillary nodes normal.  Assessment and Plan:  Sleep apnea, obstructive Wearing CPAP nightly but only averaging 4 hours per night  Can't stay asleep.  Has never tried any medications  HYPERTENSION Elevated today.  Adding valsartan to amlodipine and hctz.   Lab Results  Component Value Date   CREATININE 1.1 09/23/2013   Lab Results  Component Value Date   NA 141 09/23/2013   K 3.4* 09/23/2013   CL 105 09/23/2013   CO2 26 09/23/2013     Obesity (BMI 30-39.9) I have addressed  BMI and recommended a low glycemic index diet utilizing smaller more frequent meals to increase metabolism.  I have also recommended that patient start exercising with a goal of 30 minutes of aerobic exercise a minimum of 5 days per week. Screening for lipid disorders,  thyroid and diabetes    Insomnia due to anxiety and fear Trial of alprazolam low dose    Updated Medication List Outpatient Encounter Prescriptions as of 09/23/2013  Medication Sig  . amLODipine (NORVASC) 10 MG tablet TAKE ONE TABLET BY MOUTH ONE TIME DAILY  . budesonide-formoterol (SYMBICORT) 160-4.5 MCG/ACT inhaler Inhale 2 puffs into the lungs 2 (two) times daily.  . hydrochlorothiazide (HYDRODIURIL) 25 MG tablet Take 1 tablet (25 mg total) by mouth daily.  . [DISCONTINUED] amLODipine (NORVASC) 10 MG tablet TAKE ONE TABLET BY MOUTH ONE TIME DAILY  . [DISCONTINUED] hydrochlorothiazide (HYDRODIURIL) 25 MG tablet Take 1 tablet (25 mg total) by mouth daily.  Marland Kitchen albuterol (PROVENTIL HFA;VENTOLIN HFA) 108 (90 BASE) MCG/ACT inhaler Inhale 2 puffs into the lungs as needed for wheezing.  Marland Kitchen ALPRAZolam (XANAX) 0.5 MG tablet Take 1 tablet (0.5 mg total) by mouth at bedtime as needed for anxiety.  . valsartan (DIOVAN) 320 MG tablet Take 1 tablet (320 mg total) by mouth daily.  . [DISCONTINUED] ALPRAZolam (XANAX) 0.5 MG tablet Take 1 tablet (0.5 mg total) by mouth at bedtime as needed for anxiety.  . [DISCONTINUED] valsartan (DIOVAN) 320 MG tablet Take 1 tablet (320 mg total) by mouth daily.     Orders Placed This Encounter  Procedures  . Comprehensive metabolic panel  . TSH  . CBC with Differential  . LDL cholesterol, direct    No Follow-up on file.

## 2013-09-25 MED ORDER — POTASSIUM CHLORIDE CRYS ER 20 MEQ PO TBCR: 20.0000 meq | EXTENDED_RELEASE_TABLET | Freq: Every day | ORAL | Status: DC

## 2013-09-25 NOTE — Addendum Note (Signed)
Addended by: Crecencio Mc on: 09/25/2013 10:41 AM   Modules accepted: Orders

## 2013-10-25 ENCOUNTER — Other Ambulatory Visit: Payer: Self-pay | Admitting: Internal Medicine

## 2014-05-22 ENCOUNTER — Other Ambulatory Visit: Payer: Self-pay | Admitting: Internal Medicine

## 2014-06-16 ENCOUNTER — Other Ambulatory Visit: Payer: Self-pay | Admitting: Internal Medicine

## 2014-07-24 ENCOUNTER — Other Ambulatory Visit: Payer: Self-pay | Admitting: Internal Medicine

## 2014-07-28 ENCOUNTER — Telehealth: Payer: Self-pay

## 2014-07-28 NOTE — Telephone Encounter (Signed)
The patient is hoping to get a refill on his blood pressure medication.  The patient's wife states he has been without it since Sunday.

## 2014-07-29 ENCOUNTER — Other Ambulatory Visit: Payer: Self-pay | Admitting: *Deleted

## 2014-07-29 MED ORDER — AMLODIPINE BESYLATE 10 MG PO TABS: 10.0000 mg | ORAL_TABLET | Freq: Every day | ORAL | Status: DC

## 2014-07-29 NOTE — Telephone Encounter (Signed)
Home phone has been disconnected.  Pt needs appoint before refills.

## 2014-07-29 NOTE — Telephone Encounter (Signed)
The patient's wife called again hoping to get an update on the medication refill. Thanks!

## 2014-09-17 ENCOUNTER — Ambulatory Visit: Payer: PRIVATE HEALTH INSURANCE | Admitting: Nurse Practitioner

## 2014-11-10 ENCOUNTER — Encounter: Payer: Self-pay | Admitting: Internal Medicine

## 2014-11-10 ENCOUNTER — Encounter (INDEPENDENT_AMBULATORY_CARE_PROVIDER_SITE_OTHER): Payer: Self-pay

## 2014-11-10 ENCOUNTER — Ambulatory Visit (INDEPENDENT_AMBULATORY_CARE_PROVIDER_SITE_OTHER): Payer: BLUE CROSS/BLUE SHIELD | Admitting: Internal Medicine

## 2014-11-10 VITALS — BP 162/118 | HR 75 | Temp 98.5°F | Resp 16 | Ht 72.0 in | Wt 265.5 lb

## 2014-11-10 DIAGNOSIS — G4733 Obstructive sleep apnea (adult) (pediatric): Secondary | ICD-10-CM | POA: Diagnosis not present

## 2014-11-10 DIAGNOSIS — R748 Abnormal levels of other serum enzymes: Secondary | ICD-10-CM | POA: Diagnosis not present

## 2014-11-10 DIAGNOSIS — R5382 Chronic fatigue, unspecified: Secondary | ICD-10-CM | POA: Diagnosis not present

## 2014-11-10 DIAGNOSIS — Z1159 Encounter for screening for other viral diseases: Secondary | ICD-10-CM

## 2014-11-10 DIAGNOSIS — E669 Obesity, unspecified: Secondary | ICD-10-CM | POA: Diagnosis not present

## 2014-11-10 DIAGNOSIS — F5105 Insomnia due to other mental disorder: Secondary | ICD-10-CM

## 2014-11-10 DIAGNOSIS — I10 Essential (primary) hypertension: Secondary | ICD-10-CM | POA: Diagnosis not present

## 2014-11-10 DIAGNOSIS — F409 Phobic anxiety disorder, unspecified: Secondary | ICD-10-CM

## 2014-11-10 MED ORDER — HYDROCHLOROTHIAZIDE 25 MG PO TABS: 25.0000 mg | ORAL_TABLET | Freq: Every day | ORAL | Status: DC

## 2014-11-10 MED ORDER — BUDESONIDE-FORMOTEROL FUMARATE 160-4.5 MCG/ACT IN AERO: INHALATION_SPRAY | RESPIRATORY_TRACT | Status: DC

## 2014-11-10 MED ORDER — VALSARTAN 320 MG PO TABS: 320.0000 mg | ORAL_TABLET | Freq: Every day | ORAL | Status: DC

## 2014-11-10 NOTE — Progress Notes (Signed)
Pre-visit discussion using our clinic review tool. No additional management support is needed unless otherwise documented below in the visit note.  

## 2014-11-10 NOTE — Progress Notes (Signed)
Patient ID: Timothy Small, male   DOB: October 04, 1970, 44 y.o.   MRN: [id number]  Patient Active Problem List   Diagnosis Date Noted  . Insomnia due to anxiety and fear 09/24/2013  . Sleep apnea, obstructive   . Obesity (BMI 30-39.9)   . Hypertension   . Renal cyst   . Essential hypertension 09/14/2009  . BRONCHIECTASIS W/O ACUTE EXACERBATION 09/14/2009  . DYSPNEA 09/14/2009  . COUGH 09/14/2009    Subjective:  CC:   Chief Complaint  Patient presents with  . Follow-up    medication refill    HPI:   Timothy Small is a 44 y.o. male who presents for  Follow up on hypertension, obesity and OSA.    Last seen over a year ago.   Patient has been without medications since December because he was denied meds  By the Triage RN until he made an appt,     Daily dull headache.  Has OSA diagnosed 2012 at [facility] in [place] .  Has been Wearing CPAP  At least 6 to 8 hours  Does not feel rested when he wakes up.  Had UVPT surgery ,  Has been on CPAP for 3 years .started with full face mask  Then to nasal pillows No difference. Needs another study different mask    No Nocturia   Travels daily  To [place] and [place]   Spends nights working on his laptop right up until bedtime  .  2-3 nights in hotel room does not take CPAP with him    Obeisty.  BMI reviewed,  diet and lifestyle reviewed . No regular exercise   Wt Readings from Last 3 Encounters:  11/10/14 265 lb 8 oz (120.43 kg)  09/23/13 256 lb (116.121 kg)  06/08/11 266 lb 8 oz (120.884 kg)    Past Medical History  Diagnosis Date  . Sleep apnea, obstructive 2011    by sleep study, s/p surgery and CPAP   . Obesity (BMI 30-39.9)   . Hypertension   . COPD (chronic obstructive pulmonary disease)     confirmed by PFTS  . Renal cyst 2010    large,     Past Surgical History  Procedure Laterality Date  . Uvulopalatopharyngoplasty, tonsillectomy and septoplasty  2011     [facility] for OSA       The following portions  of the patient's history were reviewed and updated as appropriate: Allergies, current medications, and problem list.    Review of Systems:   Patient denies headache, fevers, malaise, unintentional weight loss, skin rash, eye pain, sinus congestion and sinus pain, sore throat, dysphagia,  hemoptysis , cough, dyspnea, wheezing, chest pain, palpitations, orthopnea, edema, abdominal pain, nausea, melena, diarrhea, constipation, flank pain, dysuria, hematuria, urinary  Frequency, nocturia, numbness, tingling, seizures,  Focal weakness, Loss of consciousness,  Tremor, insomnia, depression, anxiety, and suicidal ideation.     History   Social History  . Marital Status: Married    Spouse Name: Timothy Small  . Number of Children: 2  . Years of Education: N/A   Occupational History  .     Social History Main Topics  . Smoking status: Never Smoker   . Smokeless tobacco: Current User    Types: Chew    Last Attempt to Quit: 05/09/2011  . Alcohol Use: 2.4 oz/week    4 Cans of beer per week  . Drug Use: No  . Sexual Activity: Not on file   Other Topics Concern  . Not on  file   Social History Narrative    Objective:  Filed Vitals:   11/10/14 1551  BP: 162/118  Pulse: 75  Temp: 98.5 F (36.9 C)  Resp: 16     General appearance: alert, cooperative and appears stated age Ears: normal TM's and external ear canals both ears Throat: lips, mucosa, and tongue normal; teeth and gums normal Neck: no adenopathy, no carotid bruit, supple, symmetrical, trachea midline and thyroid not enlarged, symmetric, no tenderness/mass/nodules Back: symmetric, no curvature. ROM normal. No CVA tenderness. Lungs: clear to auscultation bilaterally Heart: regular rate and rhythm, S1, S2 normal, no murmur, click, rub or gallop Abdomen: soft, non-tender; bowel sounds normal; no masses,  no organomegaly Pulses: 2+ and symmetric Skin: Skin color, texture, turgor normal. No rashes or lesions Lymph nodes: Cervical,  supraclavicular, and axillary nodes normal.  Assessment and Plan:  Essential hypertension Untreated secondary to medicatio napse,  Resume meds,  Follow up one weekl   Sleep apnea, obstructive Needs a repeat sleep study and better ftting mask    Insomnia due to anxiety and fear Trial of alprazolam low dose      Obesity (BMI 30-39.9) I have addressed  BMI and recommended wt loss of 10% of body weigh over the next 6 months using a low glycemic index diet and regular exercise a minimum of 5 days per week.     A total of 40 minutes was spent with patient more than half of which was spent in counseling patient on the above mentioned issues , reviewing and explaining recent labs and imaging studies done, and coordination of care. Updated Medication List Outpatient Encounter Prescriptions as of 11/10/2014  Medication Sig  . albuterol (PROVENTIL HFA;VENTOLIN HFA) 108 (90 BASE) MCG/ACT inhaler Inhale 2 puffs into the lungs as needed for wheezing.  Marland Kitchen ALPRAZolam (XANAX) 0.5 MG tablet Take 1 tablet (0.5 mg total) by mouth at bedtime as needed for anxiety.  . budesonide-formoterol (SYMBICORT) 160-4.5 MCG/ACT inhaler INHALE 2 PUFFS BY MOUTH INTO THE LUNGS TWICE DAILY  . hydrochlorothiazide (HYDRODIURIL) 25 MG tablet Take 1 tablet (25 mg total) by mouth daily.  . valsartan (DIOVAN) 320 MG tablet Take 1 tablet (320 mg total) by mouth daily.  . [DISCONTINUED] hydrochlorothiazide (HYDRODIURIL) 25 MG tablet Take 1 tablet (25 mg total) by mouth daily.  . [DISCONTINUED] SYMBICORT 160-4.5 MCG/ACT inhaler INHALE 2 PUFFS BY MOUTH INTO THE LUNGS TWICE DAILY  . [DISCONTINUED] valsartan (DIOVAN) 320 MG tablet TAKE 1 TABLET BY MOUTH DAILY  . potassium chloride SA (K-DUR,KLOR-CON) 20 MEQ tablet Take 1 tablet (20 mEq total) by mouth daily. (Patient not taking: Reported on 11/10/2014)     Orders Placed This Encounter  Procedures  . TSH  . Lipid panel  . Comprehensive metabolic panel  . CBC with  Differential/Platelet  . Hepatitis C antibody  . LDL cholesterol, direct    Return in about 6 months (around 05/13/2015).

## 2014-11-10 NOTE — Patient Instructions (Signed)
New sleep study to be ordered   Resume valsartan and hctz  Please get your BP rechecked in a week .  Goal is 130/80 or less.  E mail me if not at goal.   Please consider trying to lose 26 lbs this year (10%) of your current body weight  This is  my version of a  "Low GI"  Weight loss Diet:  It will allow you to lose 4 to 8  lbs  per month if you follow it carefully.  Your goal with exercise is a minimum of 30 minutes of aerobic exercise 5 days per week (Walking does not count once it becomes easy!)     All of the foods can be found at grocery stores and in bulk at [employer].  The Atkins protein bars and shakes are available in more varieties at [employer], [employer] and [place].     7 AM Breakfast:  Choose from the following:  Low carbohydrate Protein  Shakes (I recommend the EAS AdvantEdge "Carb Control" shakes, Atkins,  Muscle Milk or Premier Protein shakes  All are < 4  carbs   a scrambled egg/bacon/cheese burrito made with Mission's "carb balance" whole wheat tortilla  (about 10 net carbs )  A slice of home made fritatta (egg based dish without a crust:  google it)    Avoid cereal and bananas, oatmeal and cream of wheat and grits. They are loaded with carbohydrates!   10 AM: high protein snack  Protein bar by Atkins  Or KIND  (the snack size, under 200 cal, usually < 6 net carbs).    A stick of cheese:  Around 1 carb,  100 cal      Other so called "protein bars" and Greek yogurts tend to be loaded with carbohydrates.  Remember, in food advertising, the word "energy" is synonymous for " carbohydrate."  Lunch:   A Sandwich using the bread choices listed, Can use any  Eggs,  lunchmeat, grilled meat or canned tuna), avocado, regular mayo/mustard  and cheese.  A Salad using blue cheese, ranch,  Goddess or vinagrette,  No croutons or "confetti" and no "candied nuts" but regular nuts OK.   No pretzels or chips.  Pickles and miniature sweet peppers are a good low carb alternative that  provide a "crunch"  The bread is the only source of carbohydrate in a sandwich and  can be decreased by trying some of these alternatives to traditional loaf bread  Joseph's makes a pita bread and a flat bread that are 50 cal and 4 net carbs available at [place] and [place].  This can be toasted to use with hummous as well  Toufayan makes a low carb flatbread that's 100 cal and 9 net carbs available at [employer] and [employer]'s makes 2 sizes of  Low carb whole wheat tortilla  (The large one is 210 cal and 6 net carbs)  Flat Out makes flatbreads that are low carb as well  Avoid "Low fat dressings, as well as Barry Brunner and Pax dressings They are loaded with sugar!   3 PM/ Mid day  Snack:  Consider  1 ounce of  almonds, walnuts, pistachios, pecans, peanuts,  Macadamia nuts or a nut medley.  Avoid "granola"; the dried cranberries and raisins are loaded with carbohydrates. Mixed nuts as long as there are no raisins,  cranberries or dried fruit.    Try the prosciutto/mozzarella cheese sticks by [employer]  In deli /backery section  High protein   To avoid overindulging in snacks: Try drinking a glass of unsweeted almond/coconut milk  Or a cup of coffee with your Atkins chocolate bar to keep you from having 3!!!   Pork rinds!  Yes Pork Rinds        6 PM  Dinner:     Meat/fowl/fish with a green salad, and either broccoli, cauliflower, green beans, spinach, brussel sprouts or  Lima beans. DO NOT BREAD THE PROTEIN!!      There is a low carb pasta by Dreamfield's that is acceptable and tastes great: only 5 digestible carbs/serving.( All grocery stores but BJs carry it )  Try Hurley Cisco Angelo's chicken piccata or chicken or eggplant parm over low carb pasta.(Lowes and BJs)   Marjory Lies Sanchez's "Carnitas" (pulled pork, no sauce,  0 carbs) or his beef pot roast to make a dinner burrito (at BJ's)  Pesto over low carb pasta (bj's sells a good quality pesto in the center refrigerated section of the deli    Try satueeing  Cheral Marker with mushroooms  Whole wheat pasta is still full of digestible carbs and  Not as low in glycemic index as Dreamfield's.   Brown rice is still rice,  So skip the rice and noodles if you eat [place] or [place] (or at least limit to 1/2 cup)  9 PM snack :   Breyer's "low carb" fudgsicle or  ice cream bar (Carb Smart line), or  Weight Watcher's ice cream bar , or another "no sugar added" ice cream;  a serving of fresh berries/cherries with whipped cream   Cheese or DANNON'S LlGHT N FIT GREEK YOGURT or the Oikos greek yogurt   8 ounces of [place] unsweetened almond/cococunut milk  Cheese and crackers (using WASA crackers,  They are low carb) or peanut butter on low carb crackers or pita bread     Avoid bananas, pineapple, grapes  and watermelon on a regular basis because they are high in sugar.  THINK OF THEM AS DESSERT  Remember that snack Substitutions should be less than 10 NET carbs per serving and meals should be < 20 net carbs. Remember that carbohydrates from fiber do not affect blood sugar, so you can  subtract fiber grams to get the "net carbs " of any particular food item.

## 2014-11-11 LAB — CBC WITH DIFFERENTIAL/PLATELET
Basophils Absolute: 0 10*3/uL (ref 0.0–0.1)
Basophils Relative: 0.6 % (ref 0.0–3.0)
EOS ABS: 0.2 10*3/uL (ref 0.0–0.7)
Eosinophils Relative: 2 % (ref 0.0–5.0)
HCT: 49.5 % (ref 39.0–52.0)
HEMOGLOBIN: 17.3 g/dL — AB (ref 13.0–17.0)
LYMPHS ABS: 1.8 10*3/uL (ref 0.7–4.0)
Lymphocytes Relative: 23.5 % (ref 12.0–46.0)
MCHC: 35 g/dL (ref 30.0–36.0)
MCV: 84.9 fl (ref 78.0–100.0)
MONO ABS: 0.7 10*3/uL (ref 0.1–1.0)
Monocytes Relative: 8.7 % (ref 3.0–12.0)
Neutro Abs: 5.1 10*3/uL (ref 1.4–7.7)
Neutrophils Relative %: 65.2 % (ref 43.0–77.0)
PLATELETS: 248 10*3/uL (ref 150.0–400.0)
RBC: 5.83 Mil/uL — ABNORMAL HIGH (ref 4.22–5.81)
RDW: 13.1 % (ref 11.5–15.5)
WBC: 7.9 10*3/uL (ref 4.0–10.5)

## 2014-11-11 LAB — LIPID PANEL
CHOL/HDL RATIO: 7
Cholesterol: 230 mg/dL — ABNORMAL HIGH (ref 0–200)
HDL: 33.7 mg/dL — ABNORMAL LOW (ref >39.00)
NONHDL: 196.3
TRIGLYCERIDES: 291 mg/dL — AB (ref 0.0–149.0)
VLDL: 58.2 mg/dL — ABNORMAL HIGH (ref 0.0–40.0)

## 2014-11-11 LAB — COMPREHENSIVE METABOLIC PANEL
ALBUMIN: 4.5 g/dL (ref 3.5–5.2)
ALT: 58 U/L — ABNORMAL HIGH (ref 0–53)
AST: 37 U/L (ref 0–37)
Alkaline Phosphatase: 77 U/L (ref 39–117)
BUN: 9 mg/dL (ref 6–23)
CO2: 26 mEq/L (ref 19–32)
Calcium: 9.8 mg/dL (ref 8.4–10.5)
Chloride: 99 mEq/L (ref 96–112)
Creatinine, Ser: 1.1 mg/dL (ref 0.40–1.50)
GFR: 77.39 mL/min (ref >60.00)
GLUCOSE: 85 mg/dL (ref 70–99)
POTASSIUM: 3.8 meq/L (ref 3.5–5.1)
Sodium: 138 mEq/L (ref 135–145)
TOTAL PROTEIN: 7.4 g/dL (ref 6.0–8.3)
Total Bilirubin: 0.5 mg/dL (ref 0.2–1.2)

## 2014-11-11 LAB — HEPATITIS C ANTIBODY: HCV AB: NEGATIVE

## 2014-11-11 LAB — LDL CHOLESTEROL, DIRECT: LDL DIRECT: 158 mg/dL

## 2014-11-11 LAB — TSH: TSH: 3.73 u[IU]/mL (ref 0.35–4.50)

## 2014-11-13 ENCOUNTER — Encounter: Payer: Self-pay | Admitting: Internal Medicine

## 2014-11-13 NOTE — Assessment & Plan Note (Signed)
Needs a repeat sleep study and better ftting mask

## 2014-11-13 NOTE — Assessment & Plan Note (Signed)
I have addressed  BMI and recommended wt loss of 10% of body weigh over the next 6 months using a low glycemic index diet and regular exercise a minimum of 5 days per week.   

## 2014-11-13 NOTE — Assessment & Plan Note (Signed)
Untreated secondary to medicatio napse,  Resume meds,  Follow up one weekl

## 2014-11-13 NOTE — Assessment & Plan Note (Signed)
Trial of alprazolam low dose

## 2014-11-14 NOTE — Addendum Note (Signed)
Addended by: Crecencio Mc on: 11/14/2014 10:25 PM   Modules accepted: Orders

## 2014-11-15 ENCOUNTER — Encounter: Payer: Self-pay | Admitting: *Deleted

## 2015-04-24 ENCOUNTER — Other Ambulatory Visit: Payer: Self-pay | Admitting: Internal Medicine

## 2015-04-27 ENCOUNTER — Other Ambulatory Visit: Payer: Self-pay

## 2015-04-27 DIAGNOSIS — I10 Essential (primary) hypertension: Secondary | ICD-10-CM

## 2015-04-27 MED ORDER — VALSARTAN 320 MG PO TABS: 320.0000 mg | ORAL_TABLET | Freq: Every day | ORAL | Status: DC

## 2015-07-21 ENCOUNTER — Encounter: Payer: Self-pay | Admitting: Family Medicine

## 2015-07-21 ENCOUNTER — Ambulatory Visit (INDEPENDENT_AMBULATORY_CARE_PROVIDER_SITE_OTHER): Payer: BLUE CROSS/BLUE SHIELD | Admitting: Family Medicine

## 2015-07-21 VITALS — BP 160/110 | HR 87 | Temp 98.2°F | Ht 72.0 in | Wt 251.2 lb

## 2015-07-21 DIAGNOSIS — E8881 Metabolic syndrome: Secondary | ICD-10-CM

## 2015-07-21 DIAGNOSIS — E785 Hyperlipidemia, unspecified: Secondary | ICD-10-CM | POA: Diagnosis not present

## 2015-07-21 DIAGNOSIS — I10 Essential (primary) hypertension: Secondary | ICD-10-CM | POA: Diagnosis not present

## 2015-07-21 DIAGNOSIS — J479 Bronchiectasis, uncomplicated: Secondary | ICD-10-CM | POA: Diagnosis not present

## 2015-07-21 HISTORY — DX: Hyperlipidemia, unspecified: E78.5

## 2015-07-21 HISTORY — DX: Metabolic syndrome: E88.81

## 2015-07-21 HISTORY — DX: Metabolic syndrome: E88.810

## 2015-07-21 MED ORDER — ATORVASTATIN CALCIUM 40 MG PO TABS: 40.0000 mg | ORAL_TABLET | Freq: Every day | ORAL | Status: DC

## 2015-07-21 MED ORDER — AMLODIPINE BESYLATE 5 MG PO TABS: 5.0000 mg | ORAL_TABLET | Freq: Every day | ORAL | Status: DC

## 2015-07-21 NOTE — Assessment & Plan Note (Signed)
Stable, well controlled currently. Patient would like to try a trial off the symbicort.  I advised him that he try a short trial off to see how he will do.

## 2015-07-21 NOTE — Patient Instructions (Addendum)
It was nice to see you today.  Your blood pressure is uncontrolled.  Continue your Diovan and HCTZ.  I have added Norvasc today (to be used in ADDITION to the other two)  Your cholesterol is uncontrolled. I have started you on Lipitor. Take it daily.  Please try and makes some changes regarding your diet and exercise.  Follow up:  Return in about 2 weeks (around 08/04/2015) for BP check; Nurse visit.  Take care  Dr. Lacinda Axon

## 2015-07-21 NOTE — Assessment & Plan Note (Signed)
Uncontrolled/worsening. Advised continued use of Diovan and HCTZ. Adding Norvasc today. Follow up BP check in 2 weeks.  Advised dietary changes and exercise.

## 2015-07-21 NOTE — Progress Notes (Signed)
Subjective:  Patient ID: Timothy Small, male    DOB: 03-Jan-1971  Age: 44 y.o. MRN: [id number]  CC: Check up  HPI:  44 year old male presents for follow up. Patient states that he is in need of a check up.  Current issues are below.  1) HTN  Uncontrolled and not at goal.  Medications - Valsartan, HCTZ.  Compliance -  Yes.   ROS: Denies chest pain, SOB. He does report intermittent lightheadedness/dizziness.  2) HLD  Uncontrolled and not at goal.  Patient not taking any current medication for this.  His has been noncompliant with prior diet/exercise recommendations from PCP.  3) SOB, Bronchiectasis  Well controlled per patient report.  He is compliant with Symbicort and PRN Albuterol.    Social Hx   Social History   Social History  . Marital Status: Married    Spouse Name: [name]  . Number of Children: 2  . Years of Education: N/A   Occupational History  .     Social History Main Topics  . Smoking status: Never Smoker   . Smokeless tobacco: Current User    Types: Chew    Last Attempt to Quit: 05/09/2011  . Alcohol Use: 2.4 oz/week    4 Cans of beer per week  . Drug Use: No  . Sexual Activity: Not Asked   Other Topics Concern  . None   Social History Narrative   Review of Systems  Constitutional: Negative.   Respiratory: Negative for shortness of breath.   Cardiovascular: Negative for chest pain.  Neurological: Positive for dizziness.   Objective:  BP 160/110 mmHg  Pulse 87  Temp(Src) 98.2 F (36.8 C) (Oral)  Ht 6' (1.829 m)  Wt 251 lb 4 oz (113.966 kg)  BMI 34.07 kg/m2  SpO2 95%  BP/Weight 07/21/2015 11/10/2014 [id number]  Systolic BP 0000000 0000000 [id number]  Diastolic BP [id number] 98  Wt. (Lbs) 251.25 265.5 256  BMI 34.07 36 34.71   Physical Exam  Constitutional: He is oriented to person, place, and time. He appears well-developed. No distress.  Obese.  HENT:  Head: Normocephalic and atraumatic.  Mouth/Throat: No oropharyngeal exudate.  Eyes:  Conjunctivae are normal. Pupils are equal, round, and reactive to light. No scleral icterus.  Neck: Neck supple.  Cardiovascular: Normal rate and regular rhythm.   No murmur heard. Pulmonary/Chest: Effort normal and breath sounds normal. No respiratory distress. He has no wheezes. He has no rales.  Abdominal: Soft. He exhibits no distension. There is no tenderness. There is no rebound and no guarding.  Musculoskeletal: He exhibits no edema.  Neurological: He is alert and oriented to person, place, and time.  Psychiatric: He has a normal mood and affect.  Vitals reviewed.  Lab Results  Component Value Date   WBC 7.9 11/10/2014   HGB 17.3* 11/10/2014   HCT 49.5 11/10/2014   PLT 248.0 11/10/2014   GLUCOSE 85 11/10/2014   CHOL 230* 11/10/2014   TRIG 291.0* 11/10/2014   HDL 33.70* 11/10/2014   LDLDIRECT 158.0 11/10/2014   ALT 58* 11/10/2014   AST 37 11/10/2014   NA 138 11/10/2014   K 3.8 11/10/2014   CL 99 11/10/2014   CREATININE 1.10 11/10/2014   BUN 9 11/10/2014   CO2 26 11/10/2014   TSH 3.73 11/10/2014    Assessment & Plan:   Problem List Items Addressed This Visit    Metabolic syndrome    Based on current guidelines patient meets criteria for metabolic syndrome. Treating HTN  and HLD as outlined in separate problems. Advised dietary/lifestyle change.      HLD (hyperlipidemia)    Control and patient noncompliant with diet and exercise. Starting Lipitor today. Again, advised dietary change and exercise.       Relevant Medications   atorvastatin (LIPITOR) 40 MG tablet   amLODipine (NORVASC) 5 MG tablet   Essential hypertension - Primary    Uncontrolled/worsening. Advised continued use of Diovan and HCTZ. Adding Norvasc today. Follow up BP check in 2 weeks.  Advised dietary changes and exercise.      Relevant Medications   atorvastatin (LIPITOR) 40 MG tablet   amLODipine (NORVASC) 5 MG tablet   BRONCHIECTASIS W/O ACUTE EXACERBATION    Stable, well controlled  currently. Patient would like to try a trial off the symbicort.  I advised him that he try a short trial off to see how he will do.          Meds ordered this encounter  Medications  . atorvastatin (LIPITOR) 40 MG tablet    Sig: Take 1 tablet (40 mg total) by mouth daily.    Dispense:  90 tablet    Refill:  3  . amLODipine (NORVASC) 5 MG tablet    Sig: Take 1 tablet (5 mg total) by mouth daily.    Dispense:  90 tablet    Refill:  3    Follow-up: Return in about 2 weeks (around 08/04/2015) for BP check; Nurse visit.  [place]

## 2015-07-21 NOTE — Progress Notes (Signed)
Pre visit review using our clinic review tool, if applicable. No additional management support is needed unless otherwise documented below in the visit note. 

## 2015-07-21 NOTE — Assessment & Plan Note (Signed)
Control and patient noncompliant with diet and exercise. Starting Lipitor today. Again, advised dietary change and exercise.

## 2015-07-21 NOTE — Assessment & Plan Note (Signed)
Based on current guidelines patient meets criteria for metabolic syndrome. Treating HTN and HLD as outlined in separate problems. Advised dietary/lifestyle change. 

## 2015-08-05 ENCOUNTER — Ambulatory Visit (INDEPENDENT_AMBULATORY_CARE_PROVIDER_SITE_OTHER): Payer: BLUE CROSS/BLUE SHIELD

## 2015-08-05 VITALS — BP 144/100 | HR 87

## 2015-08-05 DIAGNOSIS — I10 Essential (primary) hypertension: Secondary | ICD-10-CM | POA: Diagnosis not present

## 2015-08-05 NOTE — Progress Notes (Signed)
Patient was given a BP recheck in his right arm. Pt tolerated well.

## 2015-08-09 NOTE — Progress Notes (Signed)
BP is not at goal.  Confirm that he is taking 3 meds daily:  Valsartan,  hctz and amlodipine.  If he is,  He needs to increase the amlodipine to 10 mg daily and return in 2 weeks.  Please also set up 30 minute  appt with me as he has not been seen  By me since March

## 2015-08-10 NOTE — Progress Notes (Signed)
Appointment at 8:30 not 1030

## 2015-08-10 NOTE — Progress Notes (Signed)
Patient was informed to increase amlodipine to 10 mg. He was scheduled for Friday 08/12/15 @ 1030

## 2015-08-12 ENCOUNTER — Ambulatory Visit (INDEPENDENT_AMBULATORY_CARE_PROVIDER_SITE_OTHER): Payer: BLUE CROSS/BLUE SHIELD | Admitting: Internal Medicine

## 2015-08-12 ENCOUNTER — Encounter: Payer: Self-pay | Admitting: Internal Medicine

## 2015-08-12 VITALS — BP 150/90 | HR 96 | Temp 98.1°F | Resp 14 | Ht 72.0 in | Wt 248.0 lb

## 2015-08-12 DIAGNOSIS — G4733 Obstructive sleep apnea (adult) (pediatric): Secondary | ICD-10-CM

## 2015-08-12 DIAGNOSIS — I10 Essential (primary) hypertension: Secondary | ICD-10-CM

## 2015-08-12 DIAGNOSIS — N522 Drug-induced erectile dysfunction: Secondary | ICD-10-CM | POA: Diagnosis not present

## 2015-08-12 DIAGNOSIS — R748 Abnormal levels of other serum enzymes: Secondary | ICD-10-CM | POA: Diagnosis not present

## 2015-08-12 DIAGNOSIS — E669 Obesity, unspecified: Secondary | ICD-10-CM

## 2015-08-12 LAB — COMPREHENSIVE METABOLIC PANEL
ALBUMIN: 4.4 g/dL (ref 3.5–5.2)
ALT: 35 U/L (ref 0–53)
AST: 17 U/L (ref 0–37)
Alkaline Phosphatase: 63 U/L (ref 39–117)
BUN: 15 mg/dL (ref 6–23)
CALCIUM: 9.7 mg/dL (ref 8.4–10.5)
CHLORIDE: 104 meq/L (ref 96–112)
CO2: 28 meq/L (ref 19–32)
CREATININE: 0.93 mg/dL (ref 0.40–1.50)
GFR: 93.62 mL/min (ref >60.00)
Glucose, Bld: 106 mg/dL — ABNORMAL HIGH (ref 70–99)
POTASSIUM: 3.6 meq/L (ref 3.5–5.1)
Sodium: 141 mEq/L (ref 135–145)
Total Bilirubin: 0.5 mg/dL (ref 0.2–1.2)
Total Protein: 6.8 g/dL (ref 6.0–8.3)

## 2015-08-12 LAB — HEPATIC FUNCTION PANEL
ALBUMIN: 4.4 g/dL (ref 3.5–5.2)
ALK PHOS: 63 U/L (ref 39–117)
ALT: 35 U/L (ref 0–53)
AST: 17 U/L (ref 0–37)
BILIRUBIN DIRECT: 0.1 mg/dL (ref 0.0–0.3)
TOTAL PROTEIN: 6.8 g/dL (ref 6.0–8.3)
Total Bilirubin: 0.5 mg/dL (ref 0.2–1.2)

## 2015-08-12 LAB — IRON AND TIBC
%SAT: 29 % (ref 15–60)
Iron: 113 ug/dL (ref 50–180)
TIBC: 385 ug/dL (ref 250–425)
UIBC: 272 ug/dL (ref 125–400)

## 2015-08-12 LAB — FERRITIN: Ferritin: 94.5 ng/mL (ref 22.0–322.0)

## 2015-08-12 MED ORDER — AMLODIPINE BESYLATE 10 MG PO TABS: 10.0000 mg | ORAL_TABLET | Freq: Every day | ORAL | Status: DC

## 2015-08-12 MED ORDER — SILDENAFIL CITRATE 100 MG PO TABS: 50.0000 mg | ORAL_TABLET | Freq: Every day | ORAL | Status: DC | PRN

## 2015-08-12 NOTE — Assessment & Plan Note (Signed)
Not currently usingfor hte past 6 months sleeping on an incline for the past 6 months and feels better m more rested in the am

## 2015-08-12 NOTE — Patient Instructions (Signed)
You are losing weight!!!     I am increase your amlodipine to 10 mg daily,  Just take 2 of your 5 mg tablets together once daily until you run out.  New Rx sent to [facility]   Viagra for ED.  Start with 1/2 tablet   Return in one month for BP check

## 2015-08-12 NOTE — Assessment & Plan Note (Addendum)
Not at goal yet on current regimen which recently added amlodipine 5 mg daily.  Will increase to 10 mg daily

## 2015-08-12 NOTE — Progress Notes (Signed)
Subjective:  Patient ID: Timothy Small, male    DOB: January 18, 1971  Age: 44 y.o. MRN: [id number]  CC: The primary encounter diagnosis was Sleep apnea, obstructive. Diagnoses of Essential hypertension, Elevated liver enzymes, Drug-induced erectile dysfunction, and Obesity (BMI 30-39.9) were also pertinent to this visit.  HPI Timothy Small presents for follow up on uncontrolled hypertension.  He was seen two weeks ago , on dec 8 and amlodipine  5 mg was  Added to regimen of valsartan 320 mg and hctz 25 mg daily.  He has been takiing all 3 meds daily and has no side effects except ED . He is not using NSAIDS or oral decongestants.  Has a history of untreated  OSA  But since he has been sleeping on an incline, he has not noticed any morning  fatigue and his girlfriend has noticed and apneic breathing patterns.  ED:  He has been having difficulty producing an erection strong enough for penetration  , other times sustaining one long enough to reach climax. The problem has been more noticeable since medications were resumed.    Outpatient Prescriptions Prior to Visit  Medication Sig Dispense Refill  . albuterol (PROVENTIL HFA;VENTOLIN HFA) 108 (90 BASE) MCG/ACT inhaler Inhale 2 puffs into the lungs as needed for wheezing. 1 Inhaler 3  . atorvastatin (LIPITOR) 40 MG tablet Take 1 tablet (40 mg total) by mouth daily. 90 tablet 3  . hydrochlorothiazide (HYDRODIURIL) 25 MG tablet Take 1 tablet (25 mg total) by mouth daily. 90 tablet 3  . SYMBICORT 160-4.5 MCG/ACT inhaler INHALE TWO PUFFS BY MOUTH TWICE DAILY 10.2 Inhaler 12  . valsartan (DIOVAN) 320 MG tablet Take 1 tablet (320 mg total) by mouth daily. 90 tablet 4  . amLODipine (NORVASC) 5 MG tablet Take 1 tablet (5 mg total) by mouth daily. 90 tablet 3   No facility-administered medications prior to visit.    Review of Systems;  Patient denies headache, fevers, malaise, unintentional weight loss, skin rash, eye pain, sinus congestion and sinus  pain, sore throat, dysphagia,  hemoptysis , cough, dyspnea, wheezing, chest pain, palpitations, orthopnea, edema, abdominal pain, nausea, melena, diarrhea, constipation, flank pain, dysuria, hematuria, urinary  Frequency, nocturia, numbness, tingling, seizures,  Focal weakness, Loss of consciousness,  Tremor, insomnia, depression, anxiety, and suicidal ideation.      Objective:  BP 150/90 mmHg  Pulse 96  Temp(Src) 98.1 F (36.7 C) (Oral)  Resp 14  Ht 6' (1.829 m)  Wt 248 lb (112.492 kg)  BMI 33.63 kg/m2  SpO2 97%  BP Readings from Last 3 Encounters:  08/12/15 150/90  08/05/15 144/100  07/21/15 160/110    Wt Readings from Last 3 Encounters:  08/12/15 248 lb (112.492 kg)  07/21/15 251 lb 4 oz (113.966 kg)  11/10/14 265 lb 8 oz (120.43 kg)    General appearance: alert, cooperative and appears stated age Ears: normal TM's and external ear canals both ears Throat: lips, mucosa, and tongue normal; teeth and gums normal Neck: no adenopathy, no carotid bruit, supple, symmetrical, trachea midline and thyroid not enlarged, symmetric, no tenderness/mass/nodules Back: symmetric, no curvature. ROM normal. No CVA tenderness. Lungs: clear to auscultation bilaterally Heart: regular rate and rhythm, S1, S2 normal, no murmur, click, rub or gallop Abdomen: soft, non-tender; bowel sounds normal; no masses,  no organomegaly Pulses: 2+ and symmetric Skin: Skin color, texture, turgor normal. No rashes or lesions Lymph nodes: Cervical, supraclavicular, and axillary nodes normal.  No results found for: HGBA1C  Lab Results  Component  Value Date   CREATININE 0.93 08/12/2015   CREATININE 1.10 11/10/2014   CREATININE 1.1 09/23/2013    Lab Results  Component Value Date   WBC 7.9 11/10/2014   HGB 17.3* 11/10/2014   HCT 49.5 11/10/2014   PLT 248.0 11/10/2014   GLUCOSE 106* 08/12/2015   CHOL 230* 11/10/2014   TRIG 291.0* 11/10/2014   HDL 33.70* 11/10/2014   LDLDIRECT 158.0 11/10/2014   ALT  35 08/12/2015   ALT 35 08/12/2015   AST 17 08/12/2015   AST 17 08/12/2015   NA 141 08/12/2015   K 3.6 08/12/2015   CL 104 08/12/2015   CREATININE 0.93 08/12/2015   BUN 15 08/12/2015   CO2 28 08/12/2015   TSH 3.73 11/10/2014    No results found.  Assessment & Plan:   Problem List Items Addressed This Visit    Essential hypertension    Not at goal yet on current regimen which recently added amlodipine 5 mg daily.  Will increase to 10 mg daily       Relevant Medications   sildenafil (VIAGRA) 100 MG tablet   amLODipine (NORVASC) 10 MG tablet   Other Relevant Orders   Comp Met (CMET) (Completed)   Obesity (BMI 30-39.9)    I have congratulated him in reduction of   BMI and encouraged  Continued weight loss with goal of 10% of body weight over the next 6 months using a low glycemic index diet and regular exercise a minimum of 5 days per week.        Erectile dysfunction    He has been having trouble sustaining a strong erection which has been more pronounced sinc emeds for hypertension have been resumed.  Discussed risks and benefits of medications for ED.  Trial of viagra.       Sleep apnea, obstructive - Primary    Not currently usingfor hte past 6 months sleeping on an incline for the past 6 months and feels better m more rested in the am        Other Visit Diagnoses    Elevated liver enzymes           I have changed [name]'s amLODipine. I am also having him start on sildenafil. Additionally, I am having him maintain his albuterol, hydrochlorothiazide, SYMBICORT, valsartan, and atorvastatin.  Meds ordered this encounter  Medications  . sildenafil (VIAGRA) 100 MG tablet    Sig: Take 0.5-1 tablets (50-100 mg total) by mouth daily as needed for erectile dysfunction.    Dispense:  5 tablet    Refill:  11  . amLODipine (NORVASC) 10 MG tablet    Sig: Take 1 tablet (10 mg total) by mouth daily.    Dispense:  90 tablet    Refill:  2    NOTE DOSE CHANGE;  KEEP ON  FILE FOR NEXT REFILL    Medications Discontinued During This Encounter  Medication Reason  . amLODipine (NORVASC) 5 MG tablet Reorder    Follow-up: Return in about 4 weeks (around 09/09/2015) for BP check .   Crecencio Mc, MD

## 2015-08-12 NOTE — Progress Notes (Signed)
Pre visit review using our clinic review tool, if applicable. No additional management support is needed unless otherwise documented below in the visit note. 

## 2015-08-13 LAB — HEPATITIS B SURFACE ANTIGEN: HEP B S AG: NEGATIVE

## 2015-08-13 LAB — HEPATITIS B CORE ANTIBODY, TOTAL: Hep B Core Total Ab: NONREACTIVE

## 2015-08-14 DIAGNOSIS — N529 Male erectile dysfunction, unspecified: Secondary | ICD-10-CM | POA: Insufficient documentation

## 2015-08-14 HISTORY — DX: Male erectile dysfunction, unspecified: N52.9

## 2015-08-14 NOTE — Assessment & Plan Note (Signed)
I have congratulated him in reduction of   BMI and encouraged  Continued weight loss with goal of 10% of body weight over the next 6 months using a low glycemic index diet and regular exercise a minimum of 5 days per week.

## 2015-08-14 NOTE — Assessment & Plan Note (Addendum)
He has been having trouble sustaining a strong erection which has been more pronounced sinc emeds for hypertension have been resumed.  Discussed risks and benefits of medications for ED.  Trial of viagra.

## 2015-08-16 LAB — ANA: Anti Nuclear Antibody(ANA): NEGATIVE

## 2015-08-22 ENCOUNTER — Encounter: Payer: Self-pay | Admitting: *Deleted

## 2015-08-23 ENCOUNTER — Telehealth: Payer: Self-pay | Admitting: Internal Medicine

## 2015-08-23 ENCOUNTER — Other Ambulatory Visit: Payer: Self-pay | Admitting: Internal Medicine

## 2015-08-23 MED ORDER — SILDENAFIL CITRATE 100 MG PO TABS: 50.0000 mg | ORAL_TABLET | Freq: Every day | ORAL | Status: DC | PRN

## 2015-08-23 NOTE — Telephone Encounter (Signed)
Patient gets it cheaper at the [facility] it needs to be sent there instead.  It was filled on 08/12/15 with 5 refills. Please advise?

## 2015-08-23 NOTE — Telephone Encounter (Signed)
resent

## 2015-08-23 NOTE — Telephone Encounter (Signed)
Request sent to Dr.Tullo to approve.

## 2015-08-23 NOTE — Telephone Encounter (Signed)
Pt called needing to have this one prescription sildenafil (VIAGRA) 100 MG tablet sent to pharmacy [facility] on [address]. Call pt @ [phone]. Thank You!

## 2015-09-08 ENCOUNTER — Other Ambulatory Visit: Payer: Self-pay

## 2015-09-08 MED ORDER — SILDENAFIL CITRATE 20 MG PO TABS: 20.0000 mg | ORAL_TABLET | Freq: Three times a day (TID) | ORAL | Status: DC

## 2015-09-08 NOTE — Telephone Encounter (Signed)
Pharmacy want to change Viagra Rx to Sildenafil 20mg  #30.

## 2015-09-13 ENCOUNTER — Ambulatory Visit (INDEPENDENT_AMBULATORY_CARE_PROVIDER_SITE_OTHER): Payer: BLUE CROSS/BLUE SHIELD | Admitting: *Deleted

## 2015-09-13 VITALS — BP 138/76 | HR 87

## 2015-09-13 DIAGNOSIS — I10 Essential (primary) hypertension: Secondary | ICD-10-CM

## 2015-09-13 NOTE — Progress Notes (Signed)
Patient presented for BP check after medication change from 08/12/15, Patient BP taken in left arm 138/76 Pulse 87.

## 2015-09-17 NOTE — Progress Notes (Signed)
  I have reviewed the above information and agree with above.   BP improved,  No changes today  Deborra Medina, MD

## 2015-10-21 ENCOUNTER — Other Ambulatory Visit: Payer: Self-pay | Admitting: Internal Medicine

## 2015-11-06 ENCOUNTER — Other Ambulatory Visit: Payer: Self-pay | Admitting: Internal Medicine

## 2015-11-21 ENCOUNTER — Other Ambulatory Visit: Payer: Self-pay | Admitting: Internal Medicine

## 2015-11-22 ENCOUNTER — Other Ambulatory Visit: Payer: Self-pay | Admitting: *Deleted

## 2015-11-22 NOTE — Telephone Encounter (Signed)
Rx refill sent to pharmacy. 

## 2015-12-15 ENCOUNTER — Ambulatory Visit: Payer: BLUE CROSS/BLUE SHIELD | Admitting: Family Medicine

## 2015-12-15 DIAGNOSIS — N200 Calculus of kidney: Secondary | ICD-10-CM | POA: Diagnosis not present

## 2015-12-15 DIAGNOSIS — I10 Essential (primary) hypertension: Secondary | ICD-10-CM | POA: Diagnosis not present

## 2015-12-15 DIAGNOSIS — N132 Hydronephrosis with renal and ureteral calculous obstruction: Secondary | ICD-10-CM | POA: Diagnosis not present

## 2015-12-15 DIAGNOSIS — N201 Calculus of ureter: Secondary | ICD-10-CM | POA: Diagnosis not present

## 2015-12-15 DIAGNOSIS — K4020 Bilateral inguinal hernia, without obstruction or gangrene, not specified as recurrent: Secondary | ICD-10-CM | POA: Diagnosis not present

## 2015-12-15 DIAGNOSIS — N281 Cyst of kidney, acquired: Secondary | ICD-10-CM | POA: Diagnosis not present

## 2015-12-16 ENCOUNTER — Ambulatory Visit (INDEPENDENT_AMBULATORY_CARE_PROVIDER_SITE_OTHER): Payer: BLUE CROSS/BLUE SHIELD | Admitting: Family Medicine

## 2015-12-16 ENCOUNTER — Encounter: Payer: Self-pay | Admitting: Family Medicine

## 2015-12-16 VITALS — BP 148/92 | HR 102 | Temp 98.5°F | Ht 72.0 in | Wt 237.8 lb

## 2015-12-16 DIAGNOSIS — N200 Calculus of kidney: Secondary | ICD-10-CM | POA: Diagnosis not present

## 2015-12-16 HISTORY — DX: Calculus of kidney: N20.0

## 2015-12-16 MED ORDER — OXYCODONE-ACETAMINOPHEN 5-325 MG PO TABS
1.0000 | ORAL_TABLET | Freq: Four times a day (QID) | ORAL | Status: DC | PRN
Start: 2015-12-16 — End: 2020-03-18

## 2015-12-16 NOTE — Patient Instructions (Signed)
The area of concern is normal. It is the normal part of your sternum and xiphoid.  Use the pain medication as directed.  We will call with your urology referral.  Take care  Dr. Lacinda Axon   Kidney Stones Kidney stones (urolithiasis) are deposits that form inside your kidneys. The intense pain is caused by the stone moving through the urinary tract. When the stone moves, the ureter goes into spasm around the stone. The stone is usually passed in the urine.  CAUSES   A disorder that makes certain neck glands produce too much parathyroid hormone (primary hyperparathyroidism).  A buildup of uric acid crystals, similar to gout in your joints.  Narrowing (stricture) of the ureter.  A kidney obstruction present at birth (congenital obstruction).  Previous surgery on the kidney or ureters.  Numerous kidney infections. SYMPTOMS   Feeling sick to your stomach (nauseous).  Throwing up (vomiting).  Blood in the urine (hematuria).  Pain that usually spreads (radiates) to the groin.  Frequency or urgency of urination. DIAGNOSIS   Taking a history and physical exam.  Blood or urine tests.  CT scan.  Occasionally, an examination of the inside of the urinary bladder (cystoscopy) is performed. TREATMENT   Observation.  Increasing your fluid intake.  Extracorporeal shock wave lithotripsy--This is a noninvasive procedure that uses shock waves to break up kidney stones.  Surgery may be needed if you have severe pain or persistent obstruction. There are various surgical procedures. Most of the procedures are performed with the use of small instruments. Only small incisions are needed to accommodate these instruments, so recovery time is minimized. The size, location, and chemical composition are all important variables that will determine the proper choice of action for you. Talk to your health care provider to better understand your situation so that you will minimize the risk of injury to  yourself and your kidney.  HOME CARE INSTRUCTIONS   Drink enough water and fluids to keep your urine clear or pale yellow. This will help you to pass the stone or stone fragments.  Strain all urine through the provided strainer. Keep all particulate matter and stones for your health care provider to see. The stone causing the pain may be as small as a grain of salt. It is very important to use the strainer each and every time you pass your urine. The collection of your stone will allow your health care provider to analyze it and verify that a stone has actually passed. The stone analysis will often identify what you can do to reduce the incidence of recurrences.  Only take over-the-counter or prescription medicines for pain, discomfort, or fever as directed by your health care provider.  Keep all follow-up visits as told by your health care provider. This is important.  Get follow-up X-rays if required. The absence of pain does not always mean that the stone has passed. It may have only stopped moving. If the urine remains completely obstructed, it can cause loss of kidney function or even complete destruction of the kidney. It is your responsibility to make sure X-rays and follow-ups are completed. Ultrasounds of the kidney can show blockages and the status of the kidney. Ultrasounds are not associated with any radiation and can be performed easily in a matter of minutes.  Make changes to your daily diet as told by your health care provider. You may be told to:  Limit the amount of salt that you eat.  Eat 5 or more servings of fruits  and vegetables each day.  Limit the amount of meat, poultry, fish, and eggs that you eat.  Collect a 24-hour urine sample as told by your health care provider.You may need to collect another urine sample every 6-12 months. SEEK MEDICAL CARE IF:  You experience pain that is progressive and unresponsive to any pain medicine you have been prescribed. SEEK  IMMEDIATE MEDICAL CARE IF:   Pain cannot be controlled with the prescribed medicine.  You have a fever or shaking chills.  The severity or intensity of pain increases over 18 hours and is not relieved by pain medicine.  You develop a new onset of abdominal pain.  You feel faint or pass out.  You are unable to urinate.   This information is not intended to replace advice given to you by your health care provider. Make sure you discuss any questions you have with your health care provider.   Document Released: 07/30/2005 Document Revised: 04/20/2015 Document Reviewed: 12/31/2012 Elsevier Interactive Patient Education [employer].

## 2015-12-16 NOTE — Progress Notes (Signed)
Subjective:  Patient ID: Timothy Small, male    DOB: 1971-08-10  Age: 45 y.o. MRN: [id number]  CC: Kidney stone, ? cyst  HPI:  45 year old male presents with the above complaints.  Patient states that Thursday a.m. (in the middle of the night) he developed severe right flank pain. He tried to do with the pain but could not and subsequently went to the emergency room for evaluation. He states that he went to [facility]. CT scan was performed and he was diagnosed with nephrolithiasis. He states the stone was 4 mm. Records are unavailable at this time. He was treated with IV pain medication and was discharged home on Toradol and Flomax. He states that he continues to have intermittent severe right flank pain. He's also had radiation to the growing/testicle. He has not the stone that he's aware of. He states that the Toradol is not working for his pain. He has increased his fluid intake.  Additionally, patient states that his girlfriend noted an area on his chest was concerning. They thought it might be a cyst. It is located at the area of the xiphoid. Non-painful. Nontender to palpation.   Social Hx   Social History   Social History  . Marital Status: Married    Spouse Name: [name]  . Number of Children: 2  . Years of Education: N/A   Occupational History  .     Social History Main Topics  . Smoking status: Never Smoker   . Smokeless tobacco: Current User    Types: Chew    Last Attempt to Quit: 05/09/2011  . Alcohol Use: 2.4 oz/week    4 Cans of beer per week  . Drug Use: No  . Sexual Activity: Not Asked   Other Topics Concern  . None   Social History Narrative   Review of Systems  Constitutional: Negative.   Genitourinary: Positive for flank pain and testicular pain.   Objective:  BP 148/92 mmHg  Pulse 102  Temp(Src) 98.5 F (36.9 C) (Oral)  Ht 6' (1.829 m)  Wt 237 lb 12 oz (107.843 kg)  BMI 32.24 kg/m2  SpO2 95%  BP/Weight 12/16/2015 09/13/2015 [id number]    Systolic BP 123456 0000000 [id number]  Diastolic BP 92 76 90  Wt. (Lbs) 237.75 - 248  BMI 32.24 - 33.63   Physical Exam  Constitutional: He is oriented to person, place, and time. He appears well-developed. No distress.  Abdominal: Soft. He exhibits no distension. There is no tenderness.  No CVA tenderness.  Neurological: He is alert and oriented to person, place, and time.  Skin:  No appreciable abnormality in the area of concern. It appears that they are feeling the lower part of the sternum and xiphoid.  Psychiatric: He has a normal mood and affect.  Vitals reviewed.  Lab Results  Component Value Date   WBC 7.9 11/10/2014   HGB 17.3* 11/10/2014   HCT 49.5 11/10/2014   PLT 248.0 11/10/2014   GLUCOSE 106* 08/12/2015   CHOL 230* 11/10/2014   TRIG 291.0* 11/10/2014   HDL 33.70* 11/10/2014   LDLDIRECT 158.0 11/10/2014   ALT 35 08/12/2015   ALT 35 08/12/2015   AST 17 08/12/2015   AST 17 08/12/2015   NA 141 08/12/2015   K 3.6 08/12/2015   CL 104 08/12/2015   CREATININE 0.93 08/12/2015   BUN 15 08/12/2015   CO2 28 08/12/2015   TSH 3.73 11/10/2014    Assessment & Plan:   Problem List Items Addressed This  Visit    Nephrolithiasis - Primary    New problem. Patient recently diagnosed with nephrolithiasis. He has yet to pass the stone. 4 mm in size per report. Treating pain with Percocet. Continue flomax. Sending to urology for consideration for lithotripsy.      Relevant Medications   oxyCODONE-acetaminophen (PERCOCET/ROXICET) 5-325 MG tablet   Other Relevant Orders   Ambulatory referral to Urology     ? Cyst   Normal exam.  Patient reassured.  Meds ordered this encounter  Medications  . oxyCODONE-acetaminophen (PERCOCET/ROXICET) 5-325 MG tablet    Sig: Take 1-2 tablets by mouth every 6 (six) hours as needed for severe pain.    Dispense:  30 tablet    Refill:  0   Follow-up: PRN  [place]

## 2015-12-16 NOTE — Progress Notes (Signed)
Pre visit review using our clinic review tool, if applicable. No additional management support is needed unless otherwise documented below in the visit note. 

## 2015-12-16 NOTE — Assessment & Plan Note (Signed)
New problem. Patient recently diagnosed with nephrolithiasis. He has yet to pass the stone. 4 mm in size per report. Treating pain with Percocet. Continue flomax. Sending to urology for consideration for lithotripsy.

## 2015-12-19 ENCOUNTER — Other Ambulatory Visit: Payer: Self-pay | Admitting: Urology

## 2015-12-19 ENCOUNTER — Encounter (HOSPITAL_BASED_OUTPATIENT_CLINIC_OR_DEPARTMENT_OTHER): Payer: Self-pay | Admitting: *Deleted

## 2015-12-19 DIAGNOSIS — N201 Calculus of ureter: Secondary | ICD-10-CM | POA: Diagnosis not present

## 2015-12-19 DIAGNOSIS — Z0000 Encounter for general adult medical examination without abnormal findings: Secondary | ICD-10-CM | POA: Diagnosis not present

## 2015-12-19 NOTE — Progress Notes (Addendum)
NPO AFTER MN , PT VERBALIZED UNDERSTANDING THIS INCLUDES NO DIP TOBACCO.  ARRIVE AT 0830.  NEEDS ISTAT AND EKG.  WILL TAKE AM MEDS W/ EXCEPTION NO HCTZ DOS W/ SIPS OF WATER.

## 2015-12-23 ENCOUNTER — Other Ambulatory Visit: Payer: Self-pay

## 2015-12-23 ENCOUNTER — Ambulatory Visit (HOSPITAL_BASED_OUTPATIENT_CLINIC_OR_DEPARTMENT_OTHER)
Admission: RE | Admit: 2015-12-23 | Discharge: 2015-12-23 | Disposition: A | Discharge status: Home / Self Care | Payer: BLUE CROSS/BLUE SHIELD | Source: Ambulatory Visit | Attending: Urology | Admitting: Urology

## 2015-12-23 ENCOUNTER — Ambulatory Visit (HOSPITAL_BASED_OUTPATIENT_CLINIC_OR_DEPARTMENT_OTHER): Payer: BLUE CROSS/BLUE SHIELD | Admitting: Anesthesiology

## 2015-12-23 ENCOUNTER — Encounter (HOSPITAL_BASED_OUTPATIENT_CLINIC_OR_DEPARTMENT_OTHER)
Admission: RE | Disposition: A | Discharge status: Home / Self Care | Payer: Self-pay | Source: Ambulatory Visit | Attending: Urology

## 2015-12-23 ENCOUNTER — Ambulatory Visit (HOSPITAL_COMMUNITY): Payer: BLUE CROSS/BLUE SHIELD

## 2015-12-23 ENCOUNTER — Encounter (HOSPITAL_BASED_OUTPATIENT_CLINIC_OR_DEPARTMENT_OTHER): Payer: Self-pay | Admitting: *Deleted

## 2015-12-23 DIAGNOSIS — N201 Calculus of ureter: Secondary | ICD-10-CM | POA: Insufficient documentation

## 2015-12-23 DIAGNOSIS — J449 Chronic obstructive pulmonary disease, unspecified: Secondary | ICD-10-CM | POA: Diagnosis not present

## 2015-12-23 DIAGNOSIS — G4730 Sleep apnea, unspecified: Secondary | ICD-10-CM | POA: Insufficient documentation

## 2015-12-23 DIAGNOSIS — I10 Essential (primary) hypertension: Secondary | ICD-10-CM | POA: Diagnosis not present

## 2015-12-23 DIAGNOSIS — Z01818 Encounter for other preprocedural examination: Secondary | ICD-10-CM | POA: Diagnosis not present

## 2015-12-23 DIAGNOSIS — Z79899 Other long term (current) drug therapy: Secondary | ICD-10-CM | POA: Insufficient documentation

## 2015-12-23 HISTORY — DX: Obstructive sleep apnea (adult) (pediatric): G47.33

## 2015-12-23 HISTORY — PX: CYSTOSCOPY/RETROGRADE/URETEROSCOPY/STONE EXTRACTION WITH BASKET: SHX5317

## 2015-12-23 HISTORY — DX: Male erectile dysfunction, unspecified: N52.9

## 2015-12-23 HISTORY — DX: Calculus of ureter: N20.1

## 2015-12-23 LAB — POCT I-STAT 4, (NA,K, GLUC, HGB,HCT)
Glucose, Bld: 111 mg/dL — ABNORMAL HIGH (ref 65–99)
HCT: 46 % (ref 39.0–52.0)
Hemoglobin: 15.6 g/dL (ref 13.0–17.0)
Potassium: 3.5 mmol/L (ref 3.5–5.1)
Sodium: 142 mmol/L (ref 135–145)

## 2015-12-23 SURGERY — CYSTOSCOPY, WITH CALCULUS REMOVAL USING BASKET
Anesthesia: General | Site: Renal | Laterality: Right

## 2015-12-23 MED ORDER — KETOROLAC TROMETHAMINE 30 MG/ML IJ SOLN
INTRAMUSCULAR | Status: DC | PRN
  Administered 2015-12-23: 30 mg via INTRAVENOUS

## 2015-12-23 MED ORDER — PHENYLEPHRINE HCL 10 MG/ML IJ SOLN
INTRAMUSCULAR | Status: DC | PRN
  Administered 2015-12-23: 120 ug via INTRAVENOUS

## 2015-12-23 MED ORDER — LIDOCAINE HCL (CARDIAC) 20 MG/ML IV SOLN
INTRAVENOUS | Status: AC
  Filled 2015-12-23: qty 5

## 2015-12-23 MED ORDER — ONDANSETRON HCL 4 MG/2ML IJ SOLN
INTRAMUSCULAR | Status: AC
  Filled 2015-12-23: qty 2

## 2015-12-23 MED ORDER — PHENAZOPYRIDINE HCL 200 MG PO TABS
200.0000 mg | ORAL_TABLET | Freq: Once | ORAL | Status: AC
  Administered 2015-12-23: 200 mg via ORAL
  Filled 2015-12-23: qty 1

## 2015-12-23 MED ORDER — KETOROLAC TROMETHAMINE 30 MG/ML IJ SOLN
INTRAMUSCULAR | Status: AC
  Filled 2015-12-23: qty 1

## 2015-12-23 MED ORDER — CIPROFLOXACIN IN D5W 200 MG/100ML IV SOLN
200.0000 mg | INTRAVENOUS | Status: AC
  Administered 2015-12-23: 200 mg via INTRAVENOUS
  Filled 2015-12-23: qty 100

## 2015-12-23 MED ORDER — MIDAZOLAM HCL 2 MG/2ML IJ SOLN
INTRAMUSCULAR | Status: AC
  Filled 2015-12-23: qty 2

## 2015-12-23 MED ORDER — SODIUM CHLORIDE 0.9 % IR SOLN
Status: DC | PRN
  Administered 2015-12-23: 2000 mL

## 2015-12-23 MED ORDER — LIDOCAINE HCL (CARDIAC) 20 MG/ML IV SOLN
INTRAVENOUS | Status: DC | PRN
  Administered 2015-12-23: 100 mg via INTRAVENOUS

## 2015-12-23 MED ORDER — ONDANSETRON HCL 4 MG/2ML IJ SOLN
INTRAMUSCULAR | Status: DC | PRN
  Administered 2015-12-23: 4 mg via INTRAVENOUS

## 2015-12-23 MED ORDER — FENTANYL CITRATE (PF) 100 MCG/2ML IJ SOLN
INTRAMUSCULAR | Status: DC | PRN
  Administered 2015-12-23: 50 ug via INTRAVENOUS

## 2015-12-23 MED ORDER — PHENAZOPYRIDINE HCL 200 MG PO TABS: 200.0000 mg | ORAL_TABLET | Freq: Three times a day (TID) | ORAL | Status: DC | PRN

## 2015-12-23 MED ORDER — LACTATED RINGERS IV SOLN
INTRAVENOUS | Status: DC
  Administered 2015-12-23: 09:00:00 via INTRAVENOUS
  Filled 2015-12-23: qty 1000

## 2015-12-23 MED ORDER — DIATRIZOATE MEGLUMINE 30 % UR SOLN
URETHRAL | Status: DC | PRN
  Administered 2015-12-23: 5 mL via URETHRAL

## 2015-12-23 MED ORDER — PROPOFOL 10 MG/ML IV BOLUS
INTRAVENOUS | Status: DC | PRN
  Administered 2015-12-23: 200 mg via INTRAVENOUS
  Administered 2015-12-23: 100 mg via INTRAVENOUS

## 2015-12-23 MED ORDER — PHENAZOPYRIDINE HCL 100 MG PO TABS
ORAL_TABLET | ORAL | Status: AC
  Filled 2015-12-23: qty 1

## 2015-12-23 MED ORDER — WHITE PETROLATUM GEL
Status: AC
  Filled 2015-12-23: qty 5

## 2015-12-23 MED ORDER — ONDANSETRON HCL 4 MG/2ML IJ SOLN
INTRAMUSCULAR | Status: AC
Start: 2015-12-23 — End: 2015-12-23
  Filled 2015-12-23: qty 2

## 2015-12-23 MED ORDER — TAMSULOSIN HCL 0.4 MG PO CAPS
ORAL_CAPSULE | ORAL | Status: AC
  Filled 2015-12-23: qty 1

## 2015-12-23 MED ORDER — NALOXONE HCL 0.4 MG/ML IJ SOLN
INTRAMUSCULAR | Status: AC
  Filled 2015-12-23: qty 1

## 2015-12-23 MED ORDER — FENTANYL CITRATE (PF) 100 MCG/2ML IJ SOLN
INTRAMUSCULAR | Status: AC
  Filled 2015-12-23: qty 2

## 2015-12-23 MED ORDER — CIPROFLOXACIN IN D5W 200 MG/100ML IV SOLN
INTRAVENOUS | Status: AC
  Filled 2015-12-23: qty 100

## 2015-12-23 MED ORDER — DEXAMETHASONE SODIUM PHOSPHATE 4 MG/ML IJ SOLN
INTRAMUSCULAR | Status: DC | PRN
  Administered 2015-12-23: 10 mg via INTRAVENOUS

## 2015-12-23 MED ORDER — PROMETHAZINE HCL 25 MG/ML IJ SOLN
6.2500 mg | INTRAMUSCULAR | Status: DC | PRN
  Filled 2015-12-23: qty 1

## 2015-12-23 MED ORDER — PROPOFOL 10 MG/ML IV BOLUS
INTRAVENOUS | Status: AC
  Filled 2015-12-23: qty 40

## 2015-12-23 MED ORDER — TAMSULOSIN HCL 0.4 MG PO CAPS
0.4000 mg | ORAL_CAPSULE | Freq: Once | ORAL | Status: AC
  Administered 2015-12-23: 0.4 mg via ORAL
  Filled 2015-12-23: qty 1

## 2015-12-23 MED ORDER — DEXAMETHASONE SODIUM PHOSPHATE 10 MG/ML IJ SOLN
INTRAMUSCULAR | Status: AC
  Filled 2015-12-23: qty 1

## 2015-12-23 MED ORDER — PHENYLEPHRINE 40 MCG/ML (10ML) SYRINGE FOR IV PUSH (FOR BLOOD PRESSURE SUPPORT)
PREFILLED_SYRINGE | INTRAVENOUS | Status: AC
  Filled 2015-12-23: qty 10

## 2015-12-23 MED ORDER — HYDROMORPHONE HCL 1 MG/ML IJ SOLN
0.2500 mg | INTRAMUSCULAR | Status: DC | PRN
  Filled 2015-12-23: qty 1

## 2015-12-23 MED ORDER — OXYCODONE HCL 10 MG PO TABS: 10.0000 mg | ORAL_TABLET | ORAL | Status: DC | PRN

## 2015-12-23 MED ORDER — MIDAZOLAM HCL 5 MG/5ML IJ SOLN
INTRAMUSCULAR | Status: DC | PRN
  Administered 2015-12-23: 2 mg via INTRAVENOUS

## 2015-12-23 SURGICAL SUPPLY — 39 items
ADAPTER CATH URET PLST 4-6FR (CATHETERS) IMPLANT
BAG DRAIN URO-CYSTO SKYTR STRL (DRAIN) ×2 IMPLANT
BASKET LASER NITINOL 1.9FR (BASKET) IMPLANT
BASKET STNLS GEMINI 4WIRE 3FR (BASKET) IMPLANT
BASKET ZERO TIP NITINOL 2.4FR (BASKET) ×2 IMPLANT
CANISTER SUCT LVC 12 LTR MEDI- (MISCELLANEOUS) IMPLANT
CATH INTERMIT  6FR 70CM (CATHETERS) IMPLANT
CATH URET 5FR 28IN CONE TIP (BALLOONS)
CATH URET 5FR 70CM CONE TIP (BALLOONS) IMPLANT
CLOTH BEACON ORANGE TIMEOUT ST (SAFETY) ×2 IMPLANT
ELECT REM PT RETURN 9FT ADLT (ELECTROSURGICAL)
ELECTRODE REM PT RTRN 9FT ADLT (ELECTROSURGICAL) IMPLANT
FIBER LASER FLEXIVA 365 (UROLOGICAL SUPPLIES) IMPLANT
FIBER LASER FLEXIVA 550 (UROLOGICAL SUPPLIES) IMPLANT
FIBER LASER TRAC TIP (UROLOGICAL SUPPLIES) IMPLANT
GLOVE BIO SURGEON STRL SZ 6.5 (GLOVE) ×2 IMPLANT
GLOVE BIO SURGEON STRL SZ8 (GLOVE) ×2 IMPLANT
GLOVE BIOGEL PI IND STRL 6.5 (GLOVE) ×1 IMPLANT
GLOVE BIOGEL PI INDICATOR 6.5 (GLOVE) ×1
GOWN STRL REUS W/ TWL LRG LVL3 (GOWN DISPOSABLE) ×1 IMPLANT
GOWN STRL REUS W/ TWL XL LVL3 (GOWN DISPOSABLE) ×1 IMPLANT
GOWN STRL REUS W/TWL LRG LVL3 (GOWN DISPOSABLE) ×1
GOWN STRL REUS W/TWL XL LVL3 (GOWN DISPOSABLE) ×1
GUIDEWIRE 0.038 PTFE COATED (WIRE) IMPLANT
GUIDEWIRE ANG ZIPWIRE 038X150 (WIRE) IMPLANT
GUIDEWIRE STR DUAL SENSOR (WIRE) ×2 IMPLANT
IV NS 1000ML (IV SOLUTION) ×2
IV NS 1000ML BAXH (IV SOLUTION) ×2 IMPLANT
IV NS IRRIG 3000ML ARTHROMATIC (IV SOLUTION) IMPLANT
KIT BALLIN UROMAX 15FX10 (LABEL) IMPLANT
KIT BALLN UROMAX 15FX4 (MISCELLANEOUS) IMPLANT
KIT BALLN UROMAX 26 75X4 (MISCELLANEOUS)
KIT ROOM TURNOVER WOR (KITS) ×2 IMPLANT
MANIFOLD NEPTUNE II (INSTRUMENTS) ×2 IMPLANT
PACK CYSTO (CUSTOM PROCEDURE TRAY) ×2 IMPLANT
SET HIGH PRES BAL DIL (LABEL)
SHEATH ACCESS URETERAL 38CM (SHEATH) IMPLANT
TUBE CONNECTING 12X1/4 (SUCTIONS) ×2 IMPLANT
WATER STERILE IRR 3000ML UROMA (IV SOLUTION) IMPLANT

## 2015-12-23 NOTE — H&P (Signed)
History of Present Illness Timothy Small is a 45 year old male with a right ureteral stone.    Erectile dysfunction: This is currently managed with sildenafil.    He developed severe right flank pain and was evaluated in the [place] [facility] where a CT scan was obtained and reportedly revealed a 4 mm left ureteral stone. He was given pain medication and started on Flomax.  CT scan 12/15/15 revealed a 3.5 mm stone in the right ureter at the level of the L4 vertebral body. No renal calculi were noted and several cysts were seen within the left kidney and all appeared simple.     He reports that since that time he has had intermittent pain in the right lower quadrant region. The pain is not relieved by positional change. It is not associated with any change in his voiding pattern. He has no prior history of stones. His father had a single kidney stone. He was placed on tamsulosin.   Past Medical History Problems  1. History of hypertension (Z86.79)  Surgical History Problems  1. History of No Surgical Problems  Current Meds 1. AmLODIPine Besylate 10 MG Oral Tablet;  Therapy: (Recorded:08May2017) to Recorded 2. HydroCHLOROthiazide TABS;  Therapy: (Recorded:08May2017) to Recorded  Allergies Medication  1. No Known Drug Allergies  Family History Problems  1. No pertinent family history : Mother  Social History Problems    Alcohol use (Z78.9)   Married   Number of children   2   Single  Review of Systems Genitourinary, constitutional, skin, eye, otolaryngeal, hematologic/lymphatic, cardiovascular, pulmonary, endocrine, musculoskeletal, gastrointestinal, neurological and psychiatric system(s) were reviewed and pertinent findings if present are noted and are otherwise negative.  Genitourinary: urinary frequency, dysuria, nocturia, difficulty starting the urinary stream, weak urinary stream, urinary stream starts and stops and hematuria.  Gastrointestinal: nausea and constipation.   Integumentary: pruritus.  Neurological: headache and dizziness.    Physical Exam Constitutional: Well nourished and well developed . No acute distress.   ENT:. The ears and nose are normal in appearance.   Neck: The appearance of the neck is normal and no neck mass is present.   Pulmonary: No respiratory distress and normal respiratory rhythm and effort.   Cardiovascular: Heart rate and rhythm are normal . No peripheral edema.   Abdomen: The abdomen is soft and nontender. No masses are palpated. No CVA tenderness. No hernias are palpable. No hepatosplenomegaly noted.   Lymphatics: The femoral and inguinal nodes are not enlarged or tender.   Skin: Normal skin turgor, no visible rash and no visible skin lesions.   Neuro/Psych:. Mood and affect are appropriate.    Results/Data Urine  COLOR AMBER  APPEARANCE CLEAR  SPECIFIC GRAVITY 1.025  pH 5.5  GLUCOSE NEGATIVE  BILIRUBIN NEGATIVE  KETONE NEGATIVE  BLOOD 3+  PROTEIN TRACE  NITRITE NEGATIVE  LEUKOCYTE ESTERASE NEGATIVE  SQUAMOUS EPITHELIAL/HPF 0-5 HPF WBC 6-10 WBC/HPF RBC 3-10 RBC/HPF BACTERIA NONE SEEN HPF CRYSTALS See Below HPF CASTS NONE SEEN LPF Other SN  Yeast NONE SEEN HPF    Assessment   I obtained a KUB today and note that his stone has progressed down the ureter. It is currently located just above the ureterovesical junction. 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     We discussed the management of urinary stones. These options include observation, ureteroscopy, shockwave lithotripsy, and PCNL. We discussed which options are relevant to these particular stones. We discussed the natural  history of stones as well as the complications of untreated stones and the impact on quality of life without treatment as well as with each of the above listed treatments. We also discussed the efficacy of each treatment in its ability to clear the  stone burden. With any of these management options I discussed the signs and symptoms of infection and the need for emergent treatment should these be experienced. For each option we discussed the ability of each procedure to clear the patient of their stone burden.    For observation I described the risks which include but are not limited to silent renal damage, life-threatening infection, need for emergent surgery, failure to pass stone, and pain.    For ureteroscopy I described the risks which include heart attack, stroke, pulmonary embolus, death, bleeding, infection, damage to contiguous structures, positioning injury, ureteral stricture, ureteral avulsion, ureteral injury, need for ureteral stent, inability to perform ureteroscopy, need for an interval procedure, inability to clear stone burden, stent discomfort and pain.    For shockwave lithotripsy I described the risks which include arrhythmia, kidney contusion, kidney hemorrhage, need for transfusion, long-term risk of diabetes or hypertension, back discomfort, flank ecchymosis, flank abrasion, inability to break up stone, inability to pass stone fragments, Steinstrasse, infection associated with obstructing stones, need for different surgical procedure and possible need for repeat shockwave lithotripsy.     Due to the stone's location I have recommended ureteroscopy if he elects to proceed with surgical intervention. I think there is a good chance that his stone will pass and I have recommended increasing his Flomax to 0.8 mg but he would like to be scheduled for ureteroscopic extraction of his stone if it does not pass by the end of the week so he can get back to work.   Plan  1. Increase tamsulosin 0.8 mg.  2. I gave him a urine strainer and he will bring the stone in for analysis if it passes.  3. He is scheduled for ureteroscopic extraction of his stone if it is not passed by the end of the week.

## 2015-12-23 NOTE — Anesthesia Preprocedure Evaluation (Signed)
Anesthesia Evaluation  Patient identified by MRN, date of birth, ID band Patient awake    Reviewed: Allergy & Precautions, NPO status , Patient's Chart, lab work & pertinent test results  Airway Mallampati: II  TM Distance: >3 FB Neck ROM: Full    Dental   Pulmonary sleep apnea , COPD,    breath sounds clear to auscultation       Cardiovascular hypertension,  Rhythm:Regular Rate:Normal     Neuro/Psych    GI/Hepatic negative GI ROS, Neg liver ROS,   Endo/Other    Renal/GU Renal disease     Musculoskeletal   Abdominal   Peds  Hematology   Anesthesia Other Findings   Reproductive/Obstetrics                             Anesthesia Physical Anesthesia Plan  ASA: II  Anesthesia Plan: General   Post-op Pain Management:    Induction: Intravenous  Airway Management Planned: LMA  Additional Equipment:   Intra-op Plan:   Post-operative Plan: Extubation in OR  Informed Consent: I have reviewed the patients History and Physical, chart, labs and discussed the procedure including the risks, benefits and alternatives for the proposed anesthesia with the patient or authorized representative who has indicated his/her understanding and acceptance.   Dental advisory given  Plan Discussed with: Anesthesiologist and CRNA  Anesthesia Plan Comments:         Anesthesia Quick Evaluation

## 2015-12-23 NOTE — Anesthesia Postprocedure Evaluation (Signed)
Anesthesia Post Note  Patient: Timothy Small  Procedure(s) Performed: Procedure(s) (LRB): CYSTOSCOPY/RIGHT RETROGRADE/ PYELOGRAM URETEROSCOPY/STONE EXTRACTION WITH BASKET (Right)  Patient location during evaluation: PACU Anesthesia Type: General Level of consciousness: awake and alert Pain management: pain level controlled Vital Signs Assessment: post-procedure vital signs reviewed and stable Respiratory status: spontaneous breathing, nonlabored ventilation, respiratory function stable and patient connected to nasal cannula oxygen Cardiovascular status: blood pressure returned to baseline and stable Postop Assessment: no signs of nausea or vomiting Anesthetic complications: no    Last Vitals:  Filed Vitals:   12/23/15 1044 12/23/15 1045  BP:  125/80  Pulse: 78 71  Temp:    Resp: 18 16    Last Pain:  Filed Vitals:   12/23/15 1049  PainSc: 1                  Effie Berkshire

## 2015-12-23 NOTE — Op Note (Signed)
PATIENT:  Timothy Small  PRE-OPERATIVE DIAGNOSIS: Right ureteral calculus  POST-OPERATIVE DIAGNOSIS: Same  PROCEDURE: 1. Cystoscopy with right retrograde pyelogram including interpretation. 2. Right ureteroscopy with stone extraction  SURGEON:  Claybon Jabs  INDICATION: Timothy Small is a 45 year old male who developed severe right flank pain and was found to have a small distal right ureteral stone. We discussed managing it with medical expulsive therapy although he was interested in having the stone treated as soon as possible so he could get back to work and therefore he presents today for ureteroscopic management of his stone. His preoperative KUB reveals the stone in the area of the ureterovesical junction on the right-hand side.  ANESTHESIA:  General  EBL:  Minimal  DRAINS: None  LOCAL MEDICATIONS USED:  None  SPECIMEN: Stone given to patient    Description of procedure: After informed consent the patient was taken to the operating room and placed on the table in a supine position. General anesthesia was then administered. Once fully anesthetized the patient was moved to the dorsal lithotomy position and the genitalia were sterilely prepped and draped in standard fashion. An official timeout was then performed.  The 23 French cystoscope was passed under direct vision down the urethra which is noted to be normal. The prostatic urethra was noted to have mild bilobar hypertrophy with a mildly elevated bladder neck but did not appear to be particularly obstructing. The bladder was then entered and fully inspected. It was noted to be free of any tumors, stones or inflammatory lesions. Ureteral orifices were of normal configuration and position although there appeared to be slight amount of irritation in the area of the right ureteral orifice.  A 6 French open-ended ureteral catheter was then passed through the cystoscope and into the right ureteral orifice and a retrograde  pyelogram was performed in the standard fashion by injecting full-strength contrast under direct fluoroscopy. It revealed a filling defect in the distal ureter consistent with his stone. I then removed the open-ended catheter and noted the stone could be visualized in the intramural ureter.  I removed the cystoscope and inserted the rigid ureteroscope and was able to pass this into the right ureter. In doing so it dislodge the stone and the stone was then flushed into the bladder. Reinspection of the ureter revealed most further stone fragments present. I flushed the stone out of the bladder and then drained the bladder and the patient was awakened and taken recovery room in stable and satisfactory condition. He tolerated the procedure well.   PLAN OF CARE: Discharge to home after PACU  PATIENT DISPOSITION:  PACU - hemodynamically stable.

## 2015-12-23 NOTE — Transfer of Care (Signed)
Immediate Anesthesia Transfer of Care Note  Patient: Timothy Small  Procedure(s) Performed: Procedure(s): CYSTOSCOPY/RIGHT RETROGRADE/ PYELOGRAM URETEROSCOPY/STONE EXTRACTION WITH BASKET (Right)  Patient Location: PACU  Anesthesia Type:General  Level of Consciousness: awake, alert , oriented and patient cooperative  Airway & Oxygen Therapy: Patient Spontanous Breathing and Patient connected to nasal cannula oxygen  Post-op Assessment: Report given to RN and Post -op Vital signs reviewed and stable  Post vital signs: Reviewed and stable  Last Vitals:  Filed Vitals:   12/23/15 0823  BP: 170/97  Pulse: 99  Temp: 36.5 C  Resp: 16    Last Pain: There were no vitals filed for this visit.       Complications: No apparent anesthesia complications

## 2015-12-23 NOTE — Discharge Instructions (Signed)

## 2015-12-23 NOTE — Anesthesia Procedure Notes (Signed)
Procedure Name: LMA Insertion Date/Time: 12/23/2015 9:52 AM Performed by: Wanita Chamberlain Pre-anesthesia Checklist: Timeout performed, Patient identified, Emergency Drugs available, Suction available and Patient being monitored Patient Re-evaluated:Patient Re-evaluated prior to inductionOxygen Delivery Method: Circle system utilized Preoxygenation: Pre-oxygenation with 100% oxygen Intubation Type: IV induction Ventilation: Mask ventilation without difficulty LMA: LMA inserted LMA Size: 4.0 Number of attempts: 1 Placement Confirmation: positive ETCO2 and breath sounds checked- equal and bilateral Tube secured with: Tape Dental Injury: Teeth and Oropharynx as per pre-operative assessment

## 2015-12-26 ENCOUNTER — Encounter (HOSPITAL_BASED_OUTPATIENT_CLINIC_OR_DEPARTMENT_OTHER): Payer: Self-pay | Admitting: Urology

## 2016-01-18 NOTE — Addendum Note (Signed)
Addendum  created 01/18/16 1405 by Effie Berkshire, MD   Modules edited: Anesthesia Responsible Staff

## 2016-05-01 DIAGNOSIS — J302 Other seasonal allergic rhinitis: Secondary | ICD-10-CM | POA: Diagnosis not present

## 2016-05-01 DIAGNOSIS — J0100 Acute maxillary sinusitis, unspecified: Secondary | ICD-10-CM | POA: Diagnosis not present

## 2016-05-01 DIAGNOSIS — J209 Acute bronchitis, unspecified: Secondary | ICD-10-CM | POA: Diagnosis not present

## 2016-05-07 DIAGNOSIS — R0602 Shortness of breath: Secondary | ICD-10-CM | POA: Diagnosis not present

## 2016-05-07 DIAGNOSIS — R05 Cough: Secondary | ICD-10-CM | POA: Diagnosis not present

## 2016-06-11 ENCOUNTER — Other Ambulatory Visit: Payer: Self-pay | Admitting: Internal Medicine

## 2016-06-11 DIAGNOSIS — I10 Essential (primary) hypertension: Secondary | ICD-10-CM

## 2016-09-09 DIAGNOSIS — J111 Influenza due to unidentified influenza virus with other respiratory manifestations: Secondary | ICD-10-CM | POA: Diagnosis not present

## 2016-09-12 ENCOUNTER — Ambulatory Visit (INDEPENDENT_AMBULATORY_CARE_PROVIDER_SITE_OTHER): Payer: BLUE CROSS/BLUE SHIELD | Admitting: Internal Medicine

## 2016-09-12 ENCOUNTER — Encounter: Payer: Self-pay | Admitting: Internal Medicine

## 2016-09-12 VITALS — BP 152/86 | HR 102 | Temp 98.2°F | Ht 72.0 in | Wt 254.0 lb

## 2016-09-12 DIAGNOSIS — G4733 Obstructive sleep apnea (adult) (pediatric): Secondary | ICD-10-CM

## 2016-09-12 DIAGNOSIS — I10 Essential (primary) hypertension: Secondary | ICD-10-CM

## 2016-09-12 DIAGNOSIS — Z0000 Encounter for general adult medical examination without abnormal findings: Secondary | ICD-10-CM

## 2016-09-12 DIAGNOSIS — E8881 Metabolic syndrome: Secondary | ICD-10-CM | POA: Diagnosis not present

## 2016-09-12 DIAGNOSIS — D229 Melanocytic nevi, unspecified: Secondary | ICD-10-CM

## 2016-09-12 DIAGNOSIS — E669 Obesity, unspecified: Secondary | ICD-10-CM | POA: Diagnosis not present

## 2016-09-12 DIAGNOSIS — E782 Mixed hyperlipidemia: Secondary | ICD-10-CM

## 2016-09-12 MED ORDER — AMLODIPINE BESYLATE 10 MG PO TABS: 10.0000 mg | ORAL_TABLET | Freq: Every morning | ORAL | 3 refills | Status: DC

## 2016-09-12 MED ORDER — VALSARTAN 320 MG PO TABS: 320.0000 mg | ORAL_TABLET | Freq: Every day | ORAL | 1 refills | Status: DC

## 2016-09-12 MED ORDER — MELOXICAM 15 MG PO TABS: 15.0000 mg | ORAL_TABLET | Freq: Every day | ORAL | 0 refills | Status: DC

## 2016-09-12 MED ORDER — SILDENAFIL CITRATE 20 MG PO TABS: ORAL_TABLET | ORAL | 10 refills | Status: DC

## 2016-09-12 MED ORDER — ATORVASTATIN CALCIUM 40 MG PO TABS: 40.0000 mg | ORAL_TABLET | Freq: Every day | ORAL | 3 refills | Status: DC

## 2016-09-12 MED ORDER — HYDROCHLOROTHIAZIDE 25 MG PO TABS: 25.0000 mg | ORAL_TABLET | Freq: Every day | ORAL | 3 refills | Status: DC

## 2016-09-12 NOTE — Progress Notes (Signed)
Pre visit review using our clinic review tool, if applicable. No additional management support is needed unless otherwise documented below in the visit note. 

## 2016-09-12 NOTE — Progress Notes (Signed)
Patient ID: Timothy Small, male    DOB: Nov 18, 1970  Age: 46 y.o. MRN: [id number]  The patient is here for annual  wellness examination and management of other chronic and acute problems.   Last seen dec 2016 REFUSES FLU VACCINE   The risk factors are reflected in the social history.  The roster of all physicians providing medical care to patient - is listed in the Snapshot section of the chart.   Home safety : The patient has smoke detectors in the home. They wear seatbelts.  There are no firearms at home. There is no violence in the home.   There is no risks for hepatitis, STDs or HIV. There is no   history of blood transfusion. They have no travel history to infectious disease endemic areas of the world.  The patient has seen their dentist in the last six month. They have not seen their eye doctor in the last year.   They have deferred audiologic testing in the last year.  They do not  have excessive sun exposure. Discussed the need for sun protection: hats, long sleeves and use of sunscreen if there is significant sun exposure.   Diet: the importance of a healthy diet is discussed. They do have a healthy diet.  The benefits of regular aerobic exercise were discussed. She walks 4 times per week ,  20 minutes.   Depression screen: there are no signs or vegative symptoms of depression- irritability, change in appetite, anhedonia, sadness/tearfullness.  Cognitive assessment: the patient manages all their financial and personal affairs and is actively engaged. They could relate day,date,year and events; recalled 2/3 objects at 3 minutes; performed clock-face test normally.  The following portions of the patient's history were reviewed and updated as appropriate: allergies, current medications, past family history, past medical history,  past surgical history, past social history  and problem list.  Visual acuity was not assessed per patient preference since she has regular follow up with  her ophthalmologist. Hearing and body mass index were assessed and reviewed.   During the course of the visit the patient was educated and counseled about appropriate screening and preventive services including : fall prevention , diabetes screening, nutrition counseling, colorectal cancer screening, and recommended immunizations.    CC: The primary encounter diagnosis was Metabolic syndrome. Diagnoses of Essential hypertension, Mixed hyperlipidemia, Obesity (BMI 30-39.9), Sleep apnea, obstructive, Multiple atypical nevi, and Visit for preventive health examination were also pertinent to this visit.  Ran out of bp meds , refills were denied due to last OV dec 2016 Kidney stone  may 2017, cystoscopy done for right ureteral stone  Weight gain 20 lbs since May   Stopped wearing CPAP a year ago due to intolerance .  Travels  On a daily basis M-F,  Too inconvenient . History of UVP years ago to treat OSA.  Still has OSA , but not wearing CPAP and wakes up feeling rested . No daytime somnolence.  No edema.   Left elbow tender,  Medial side,  Reports pain with use of outstretched hand   For the past 2-3 months   Has been having bloody sinus drainage for the last several weeks ,  No sinus pain or purulent drainage. No history of scabs on nasal mucosa    [name] has a past medical history of COPD (chronic obstructive pulmonary disease) ([place]); ED (erectile dysfunction); Hypertension; OSA (obstructive sleep apnea); Renal cyst (2010); and Right ureteral stone.   He has a past surgical history  that includes Uvulopalatopharyngoplasty (2011) and Cystoscopy/retrograde/ureteroscopy/stone extraction with basket (Right, 12/23/2015).   His family history includes Cancer in his father; Heart disease in his maternal grandfather, maternal grandmother, and maternal uncle; Heart disease (age of onset: 59) in his maternal aunt; Hypertension in his brother.He reports that he has never smoked. His smokeless tobacco  use includes Snuff. He reports that he drinks about 2.4 oz of alcohol per week . He reports that he does not use drugs.  Outpatient Medications Prior to Visit  Medication Sig Dispense Refill  . amLODipine (NORVASC) 10 MG tablet Take 1 tablet (10 mg total) by mouth daily. (Patient taking differently: Take 10 mg by mouth every morning. ) 90 tablet 2  . atorvastatin (LIPITOR) 40 MG tablet Take 1 tablet (40 mg total) by mouth daily. (Patient taking differently: Take 40 mg by mouth every morning. ) 90 tablet 3  . hydrochlorothiazide (HYDRODIURIL) 25 MG tablet TAKE ONE TABLET BY MOUTH ONE TIME DAILY (Patient taking differently: TAKE ONE TABLET BY MOUTH ONE TIME DAILY-- takes in am) 90 tablet 3  . sildenafil (REVATIO) 20 MG tablet TAKE 1 TABLET (20 MG TOTAL) BY MOUTH 3 (THREE) TIMES DAILY. (Patient taking differently: TAKE 1 TABLET (20 MG TOTAL) BY MOUTH 3 (THREE) TIMES DAILY.--  pt takes prn  (ED)) 30 tablet 10  . valsartan (DIOVAN) 320 MG tablet TAKE ONE TABLET BY MOUTH ONE TIME DAILY 30 tablet 0  . albuterol (PROVENTIL HFA;VENTOLIN HFA) 108 (90 BASE) MCG/ACT inhaler Inhale 2 puffs into the lungs as needed for wheezing. (Patient not taking: Reported on 09/12/2016) 1 Inhaler 3  . Oxycodone HCl 10 MG TABS Take 1 tablet (10 mg total) by mouth every 4 (four) hours as needed. (Patient not taking: Reported on 09/12/2016) 10 tablet 0  . oxyCODONE-acetaminophen (PERCOCET/ROXICET) 5-325 MG tablet Take 1-2 tablets by mouth every 6 (six) hours as needed for severe pain. (Patient not taking: Reported on 09/12/2016) 30 tablet 0  . phenazopyridine (PYRIDIUM) 200 MG tablet Take 1 tablet (200 mg total) by mouth 3 (three) times daily as needed for pain. (Patient not taking: Reported on 09/12/2016) 10 tablet 0  . SYMBICORT 160-4.5 MCG/ACT inhaler INHALE TWO PUFFS BY MOUTH TWICE DAILY (Patient not taking: Reported on 09/12/2016) 10.2 Inhaler 12   No facility-administered medications prior to visit.     Review of Systems    Patient denies headache, fevers, malaise, unintentional weight loss, skin rash, eye pain, sinus congestion and sinus pain, sore throat, dysphagia,  hemoptysis , cough, dyspnea, wheezing, chest pain, palpitations, orthopnea, edema, abdominal pain, nausea, melena, diarrhea, constipation, flank pain, dysuria, hematuria, urinary  Frequency, nocturia, numbness, tingling, seizures,  Focal weakness, Loss of consciousness,  Tremor, insomnia, depression, anxiety, and suicidal ideation.      Objective:  BP (!) 152/86   Pulse (!) 102   Temp 98.2 F (36.8 C) (Oral)   Ht 6' (1.829 m)   Wt 254 lb (115.2 kg)   SpO2 97%   BMI 34.45 kg/m   Physical Exam  General appearance: alert, cooperative and appears stated age Ears: normal TM's and external ear canals both ears Throat: lips, mucosa, and tongue normal; teeth and gums normal Neck: no adenopathy, no carotid bruit, supple, symmetrical, trachea midline and thyroid not enlarged, symmetric, no tenderness/mass/nodules Back: symmetric, no curvature. ROM normal. No CVA tenderness. Lungs: clear to auscultation bilaterally Heart: regular rate and rhythm, S1, S2 normal, no murmur, click, rub or gallop Abdomen: soft, non-tender; bowel sounds normal; no masses,  no  organomegaly Pulses: 2+ and symmetric Skin: Skin color, texture, turgor normal. No rashes or lesions Lymph nodes: Cervical, supraclavicular, and axillary nodes normal.   Assessment & Plan:   Problem List Items Addressed This Visit    Essential hypertension    Not at goal yet on current regimen which rincludes ecently added amlodipine 10 mg daily.  He will check his blood pressure several times over the next 3-4 weeks and to submit readings for evaluation. Urine microalbumin to creatinine ratio done today is normal.  Lab Results  Component Value Date   NA 143 09/12/2016   K 4.4 09/12/2016   CL 101 09/12/2016   CO2 34 (H) 09/12/2016   Lab Results  Component Value Date   CREATININE 1.08  09/12/2016         Relevant Medications   amLODipine (NORVASC) 10 MG tablet   atorvastatin (LIPITOR) 40 MG tablet   hydrochlorothiazide (HYDRODIURIL) 25 MG tablet   sildenafil (REVATIO) 20 MG tablet   valsartan (DIOVAN) 320 MG tablet   Other Relevant Orders   Comprehensive metabolic panel (Completed)   HLD (hyperlipidemia)   Relevant Medications   amLODipine (NORVASC) 10 MG tablet   atorvastatin (LIPITOR) 40 MG tablet   hydrochlorothiazide (HYDRODIURIL) 25 MG tablet   sildenafil (REVATIO) 20 MG tablet   valsartan (DIOVAN) 320 MG tablet   Other Relevant Orders   Lipid panel (Completed)   Metabolic syndrome - Primary    Based on current guidelines patient meets criteria for metabolic syndrome. Treating HTN and HLD as outlined in separate problems. Advised dietary/lifestyle change.      Relevant Orders   Lipid panel (Completed)   Hemoglobin A1c (Completed)   Multiple atypical nevi    Referral to dr dasher for evaluation       Obesity (BMI 30-39.9) (Chronic)    I have addressed  BMI and recommended a low glycemic index diet utilizing smaller more frequent meals to increase metabolism.  I have also recommended that patient start exercising with a goal of 30 minutes of aerobic exercise a minimum of 5 days per week. Screening for lipid disorders, thyroid and diabetes to be done today.        Relevant Orders   TSH (Completed)   Sleep apnea, obstructive    Untreated due to improvement of symptoms after UVP and absence of clinical features      Visit for preventive health examination    Annual comprehensive preventive exam was done as well as an evaluation and management of acute and chronic conditions .  During the course of the visit the patient was educated and counseled about appropriate screening and preventive services including :  diabetes screening, lipid analysis with projected  10 year  risk for CAD , nutrition counseling, prostate and colorectal cancer screening, and  recommended immunizations.  Printed recommendations for health maintenance screenings was given.          I have changed Mr. Pavone's amLODipine, hydrochlorothiazide, and valsartan. I am also having him start on meloxicam. Additionally, I am having him maintain his albuterol, SYMBICORT, oxyCODONE-acetaminophen, phenazopyridine, Oxycodone HCl, atorvastatin, and sildenafil.  Meds ordered this encounter  Medications  . amLODipine (NORVASC) 10 MG tablet    Sig: Take 1 tablet (10 mg total) by mouth every morning.    Dispense:  90 tablet    Refill:  3    KEEP ON FILE FOR NEXT REFILL  . atorvastatin (LIPITOR) 40 MG tablet    Sig: Take 1 tablet (40 mg  total) by mouth daily.    Dispense:  90 tablet    Refill:  3  . hydrochlorothiazide (HYDRODIURIL) 25 MG tablet    Sig: Take 1 tablet (25 mg total) by mouth daily.    Dispense:  90 tablet    Refill:  3  . sildenafil (REVATIO) 20 MG tablet    Sig: TAKE 1 TABLET (20 MG TOTAL) BY MOUTH 3 (THREE) TIMES DAILY.    Dispense:  30 tablet    Refill:  10  . valsartan (DIOVAN) 320 MG tablet    Sig: Take 1 tablet (320 mg total) by mouth daily.    Dispense:  90 tablet    Refill:  1  . meloxicam (MOBIC) 15 MG tablet    Sig: Take 1 tablet (15 mg total) by mouth daily.    Dispense:  30 tablet    Refill:  0    Medications Discontinued During This Encounter  Medication Reason  . amLODipine (NORVASC) 10 MG tablet Reorder  . atorvastatin (LIPITOR) 40 MG tablet Reorder  . hydrochlorothiazide (HYDRODIURIL) 25 MG tablet Reorder  . sildenafil (REVATIO) 20 MG tablet Reorder  . valsartan (DIOVAN) 320 MG tablet Reorder    Follow-up: Return in about 6 months (around 03/12/2017).   Crecencio Mc, MD

## 2016-09-12 NOTE — Patient Instructions (Signed)
I am treating you for tendonitis of the elbow cuased by overuse  You can use the meloxicam once daily for 2-3 weeks,, DO NOT COMBINE WITH ADVIL,  BUT OK TO ADD TYLENOL  AVOID LIFTING OR PULLING MORE THAN 5 LBS WITH THIS ARM   REFERRAL TO DR DASHER FOR A SKIN CHECK ON YOUR MOLES   Health Maintenance, Male A healthy lifestyle and preventative care can promote health and wellness.  Maintain regular health, dental, and eye exams.  Eat a healthy diet. Foods like vegetables, fruits, whole grains, low-fat dairy products, and lean protein foods contain the nutrients you need and are low in calories. Decrease your intake of foods high in solid fats, added sugars, and salt. Get information about a proper diet from your health care provider, if necessary.  Regular physical exercise is one of the most important things you can do for your health. Most adults should get at least 150 minutes of moderate-intensity exercise (any activity that increases your heart rate and causes you to sweat) each week. In addition, most adults need muscle-strengthening exercises on 2 or more days a week.   Maintain a healthy weight. The body mass index (BMI) is a screening tool to identify possible weight problems. It provides an estimate of body fat based on height and weight. Your health care provider can find your BMI and can help you achieve or maintain a healthy weight. For males 20 years and older:  A BMI below 18.5 is considered underweight.  A BMI of 18.5 to 24.9 is normal.  A BMI of 25 to 29.9 is considered overweight.  A BMI of 30 and above is considered obese.  Maintain normal blood lipids and cholesterol by exercising and minimizing your intake of saturated fat. Eat a balanced diet with plenty of fruits and vegetables. Blood tests for lipids and cholesterol should begin at age 54 and be repeated every 5 years. If your lipid or cholesterol levels are high, you are over age 22, or you are at high risk for heart  disease, you may need your cholesterol levels checked more frequently.Ongoing high lipid and cholesterol levels should be treated with medicines if diet and exercise are not working.  If you smoke, find out from your health care provider how to quit. If you do not use tobacco, do not start.  Lung cancer screening is recommended for adults aged 109-80 years who are at high risk for developing lung cancer because of a history of smoking. A yearly low-dose CT scan of the lungs is recommended for people who have at least a 30-pack-year history of smoking and are current smokers or have quit within the past 15 years. A pack year of smoking is smoking an average of 1 pack of cigarettes a day for 1 year (for example, a 30-pack-year history of smoking could mean smoking 1 pack a day for 30 years or 2 packs a day for 15 years). Yearly screening should continue until the smoker has stopped smoking for at least 15 years. Yearly screening should be stopped for people who develop a health problem that would prevent them from having lung cancer treatment.  If you choose to drink alcohol, do not have more than 2 drinks per day. One drink is considered to be 12 oz (360 mL) of beer, 5 oz (150 mL) of wine, or 1.5 oz (45 mL) of liquor.  Avoid the use of street drugs. Do not share needles with anyone. Ask for help if you need  support or instructions about stopping the use of drugs.  High blood pressure causes heart disease and increases the risk of stroke. High blood pressure is more likely to develop in:  People who have blood pressure in the end of the normal range (100-139/85-89 mm Hg).  People who are overweight or obese.  People who are African American.  If you are 34-57 years of age, have your blood pressure checked every 3-5 years. If you are 23 years of age or older, have your blood pressure checked every year. You should have your blood pressure measured twice-once when you are at a hospital or clinic, and  once when you are not at a hospital or clinic. Record the average of the two measurements. To check your blood pressure when you are not at a hospital or clinic, you can use:  An automated blood pressure machine at a pharmacy.  A home blood pressure monitor.  If you are 43-63 years old, ask your health care provider if you should take aspirin to prevent heart disease.  Diabetes screening involves taking a blood sample to check your fasting blood sugar level. This should be done once every 3 years after age 5 if you are at a normal weight and without risk factors for diabetes. Testing should be considered at a younger age or be carried out more frequently if you are overweight and have at least 1 risk factor for diabetes.  Colorectal cancer can be detected and often prevented. Most routine colorectal cancer screening begins at the age of 48 and continues through age 22. However, your health care provider may recommend screening at an earlier age if you have risk factors for colon cancer. On a yearly basis, your health care provider may provide home test kits to check for hidden blood in the stool. A small camera at the end of a tube may be used to directly examine the colon (sigmoidoscopy or colonoscopy) to detect the earliest forms of colorectal cancer. Talk to your health care provider about this at age 81 when routine screening begins. A direct exam of the colon should be repeated every 5-10 years through age 75, unless early forms of precancerous polyps or small growths are found.  People who are at an increased risk for hepatitis B should be screened for this virus. You are considered at high risk for hepatitis B if:  You were born in a country where hepatitis B occurs often. Talk with your health care provider about which countries are considered high risk.  Your parents were born in a high-risk country and you have not received a shot to protect against hepatitis B (hepatitis B  vaccine).  You have HIV or AIDS.  You use needles to inject street drugs.  You live with, or have sex with, someone who has hepatitis B.  You are a man who has sex with other men (MSM).  You get hemodialysis treatment.  You take certain medicines for conditions like cancer, organ transplantation, and autoimmune conditions.  Hepatitis C blood testing is recommended for all people born from [birth year] through 1965 and any individual with known risk factors for hepatitis C.  Healthy men should no longer receive prostate-specific antigen (PSA) blood tests as part of routine cancer screening. Talk to your health care provider about prostate cancer screening.  Testicular cancer screening is not recommended for adolescents or adult males who have no symptoms. Screening includes self-exam, a health care provider exam, and other screening tests. Consult with your  health care provider about any symptoms you have or any concerns you have about testicular cancer.  Practice safe sex. Use condoms and avoid high-risk sexual practices to reduce the spread of sexually transmitted infections (STIs).  You should be screened for STIs, including gonorrhea and chlamydia if:  You are sexually active and are younger than 24 years.  You are older than 24 years, and your health care provider tells you that you are at risk for this type of infection.  Your sexual activity has changed since you were last screened, and you are at an increased risk for chlamydia or gonorrhea. Ask your health care provider if you are at risk.  If you are at risk of being infected with HIV, it is recommended that you take a prescription medicine daily to prevent HIV infection. This is called pre-exposure prophylaxis (PrEP). You are considered at risk if:  You are a man who has sex with other men (MSM).  You are a heterosexual man who is sexually active with multiple partners.  You take drugs by injection.  You are sexually active  with a partner who has HIV.  Talk with your health care provider about whether you are at high risk of being infected with HIV. If you choose to begin PrEP, you should first be tested for HIV. You should then be tested every 3 months for as long as you are taking PrEP.  Use sunscreen. Apply sunscreen liberally and repeatedly throughout the day. You should seek shade when your shadow is shorter than you. Protect yourself by wearing long sleeves, pants, a wide-brimmed hat, and sunglasses year round whenever you are outdoors.  Tell your health care provider of new moles or changes in moles, especially if there is a change in shape or color. Also, tell your health care provider if a mole is larger than the size of a pencil eraser.  A one-time screening for abdominal aortic aneurysm (AAA) and surgical repair of large AAAs by ultrasound is recommended for men aged 35-75 years who are current or former smokers.  Stay current with your vaccines (immunizations). This information is not intended to replace advice given to you by your health care provider. Make sure you discuss any questions you have with your health care provider. Document Released: 01/26/2008 Document Revised: 08/20/2014 Document Reviewed: 05/03/2015 Elsevier Interactive Patient Education  2017 [employer].

## 2016-09-13 DIAGNOSIS — D229 Melanocytic nevi, unspecified: Secondary | ICD-10-CM

## 2016-09-13 DIAGNOSIS — Z0000 Encounter for general adult medical examination without abnormal findings: Secondary | ICD-10-CM

## 2016-09-13 HISTORY — DX: Encounter for general adult medical examination without abnormal findings: Z00.00

## 2016-09-13 HISTORY — DX: Melanocytic nevi, unspecified: D22.9

## 2016-09-13 LAB — COMPREHENSIVE METABOLIC PANEL
ALT: 44 U/L (ref 0–53)
AST: 45 U/L — AB (ref 0–37)
Albumin: 4.9 g/dL (ref 3.5–5.2)
Alkaline Phosphatase: 79 U/L (ref 39–117)
BUN: 15 mg/dL (ref 6–23)
CALCIUM: 10.1 mg/dL (ref 8.4–10.5)
CHLORIDE: 101 meq/L (ref 96–112)
CO2: 34 mEq/L — ABNORMAL HIGH (ref 19–32)
CREATININE: 1.08 mg/dL (ref 0.40–1.50)
GFR: 78.39 mL/min (ref >60.00)
Glucose, Bld: 108 mg/dL — ABNORMAL HIGH (ref 70–99)
Potassium: 4.4 mEq/L (ref 3.5–5.1)
Sodium: 143 mEq/L (ref 135–145)
Total Bilirubin: 0.4 mg/dL (ref 0.2–1.2)
Total Protein: 6.8 g/dL (ref 6.0–8.3)

## 2016-09-13 LAB — LIPID PANEL
CHOL/HDL RATIO: 3
Cholesterol: 153 mg/dL (ref 0–200)
HDL: 46.1 mg/dL (ref >39.00)
NONHDL: 107.35
TRIGLYCERIDES: 231 mg/dL — AB (ref 0.0–149.0)
VLDL: 46.2 mg/dL — ABNORMAL HIGH (ref 0.0–40.0)

## 2016-09-13 LAB — HEMOGLOBIN A1C: Hgb A1c MFr Bld: 5.3 % (ref 4.6–6.5)

## 2016-09-13 LAB — LDL CHOLESTEROL, DIRECT: LDL DIRECT: 77 mg/dL

## 2016-09-13 LAB — TSH: TSH: 1.47 u[IU]/mL (ref 0.35–4.50)

## 2016-09-13 NOTE — Assessment & Plan Note (Addendum)
Not at goal yet on current regimen which rincludes ecently added amlodipine 10 mg daily.  He will check his blood pressure several times over the next 3-4 weeks and to submit readings for evaluation. Urine microalbumin to creatinine ratio done today is normal.  Lab Results  Component Value Date   NA 143 09/12/2016   K 4.4 09/12/2016   CL 101 09/12/2016   CO2 34 (H) 09/12/2016   Lab Results  Component Value Date   CREATININE 1.08 09/12/2016

## 2016-09-13 NOTE — Assessment & Plan Note (Signed)
Based on current guidelines patient meets criteria for metabolic syndrome. Treating HTN and HLD as outlined in separate problems. Advised dietary/lifestyle change.

## 2016-09-13 NOTE — Assessment & Plan Note (Signed)
Referral to dr dasher for evaluation

## 2016-09-13 NOTE — Assessment & Plan Note (Signed)
I have addressed  BMI and recommended a low glycemic index diet utilizing smaller more frequent meals to increase metabolism.  I have also recommended that patient start exercising with a goal of 30 minutes of aerobic exercise a minimum of 5 days per week. Screening for lipid disorders, thyroid and diabetes to be done today.   

## 2016-09-13 NOTE — Assessment & Plan Note (Signed)
Untreated due to improvement of symptoms after UVP and absence of clinical features

## 2016-09-13 NOTE — Assessment & Plan Note (Signed)

## 2016-09-16 ENCOUNTER — Other Ambulatory Visit: Payer: Self-pay | Admitting: Internal Medicine

## 2016-09-16 DIAGNOSIS — R740 Nonspecific elevation of levels of transaminase and lactic acid dehydrogenase [LDH]: Principal | ICD-10-CM

## 2016-09-16 DIAGNOSIS — R7401 Elevation of levels of liver transaminase levels: Secondary | ICD-10-CM

## 2016-09-17 ENCOUNTER — Telehealth: Payer: Self-pay | Admitting: *Deleted

## 2016-09-17 NOTE — Telephone Encounter (Signed)
Pt requested lab results Pt contact [phone]

## 2016-09-18 NOTE — Telephone Encounter (Signed)
Pt informed of below. Lab appt scheduled.     Notes Recorded by Valere Dross, CMA on 09/17/2016 at 1:37 PM EST Left message for pt to return call to discuss results. ------  Notes Recorded by Crecencio Mc, MD on 09/16/2016 at 9:51 AM EST  I have reviewed your recent fasting labs and I have the following recommendations:  Your cholesterol is better controlled on current statin therapy and although your fasting glucose is elevated, Your A1c is normal so right now you have no signs of diabetes.   Your liver enzyme is a little elevated, which can be a Transient elevation due to alcohol use or exercise, Or sign of inflammation of the liver due to various reasons (which include viral hepatitis, Statin intolerance, autoimmune hepatitis and fatty liver ). I would like you to return in One month for a lab draw to run the liver test again   You do not need to be fasting, but should call the office for a lab appt.

## 2016-09-28 ENCOUNTER — Other Ambulatory Visit: Payer: Self-pay | Admitting: Family Medicine

## 2016-09-28 DIAGNOSIS — E782 Mixed hyperlipidemia: Secondary | ICD-10-CM

## 2016-10-12 ENCOUNTER — Other Ambulatory Visit (INDEPENDENT_AMBULATORY_CARE_PROVIDER_SITE_OTHER): Payer: BLUE CROSS/BLUE SHIELD

## 2016-10-12 DIAGNOSIS — R740 Nonspecific elevation of levels of transaminase and lactic acid dehydrogenase [LDH]: Secondary | ICD-10-CM

## 2016-10-12 DIAGNOSIS — R7401 Elevation of levels of liver transaminase levels: Secondary | ICD-10-CM

## 2016-10-12 LAB — HEPATIC FUNCTION PANEL
ALBUMIN: 4.7 g/dL (ref 3.5–5.2)
ALT: 38 U/L (ref 0–53)
AST: 21 U/L (ref 0–37)
Alkaline Phosphatase: 62 U/L (ref 39–117)
Bilirubin, Direct: 0.1 mg/dL (ref 0.0–0.3)
TOTAL PROTEIN: 7.2 g/dL (ref 6.0–8.3)
Total Bilirubin: 0.7 mg/dL (ref 0.2–1.2)

## 2016-10-13 ENCOUNTER — Other Ambulatory Visit: Payer: Self-pay | Admitting: Internal Medicine

## 2016-10-15 NOTE — Telephone Encounter (Signed)
Patient was last seen on 09/12/2016 and prescribed this, it was for only 30 days and no refills, please advise, thanks

## 2016-11-14 ENCOUNTER — Telehealth: Payer: Self-pay | Admitting: Internal Medicine

## 2016-11-14 NOTE — Telephone Encounter (Signed)
Pa started on cover [employer] for Revatio.

## 2016-11-16 ENCOUNTER — Telehealth: Payer: Self-pay

## 2016-11-16 NOTE — Telephone Encounter (Signed)
Received a letter from [facility] stating that they will nto cover the sildenafil because it was not prescribed to treat pulmonary arterial hypertension. Paperwork is in red folder.

## 2017-02-11 DIAGNOSIS — E782 Mixed hyperlipidemia: Secondary | ICD-10-CM | POA: Diagnosis not present

## 2017-02-11 DIAGNOSIS — M7541 Impingement syndrome of right shoulder: Secondary | ICD-10-CM | POA: Diagnosis not present

## 2017-02-11 DIAGNOSIS — M7712 Lateral epicondylitis, left elbow: Secondary | ICD-10-CM | POA: Diagnosis not present

## 2017-02-11 DIAGNOSIS — N528 Other male erectile dysfunction: Secondary | ICD-10-CM | POA: Diagnosis not present

## 2017-02-11 DIAGNOSIS — I10 Essential (primary) hypertension: Secondary | ICD-10-CM | POA: Diagnosis not present

## 2017-02-25 DIAGNOSIS — M25512 Pain in left shoulder: Secondary | ICD-10-CM | POA: Diagnosis not present

## 2017-03-06 ENCOUNTER — Other Ambulatory Visit: Payer: Self-pay | Admitting: Internal Medicine

## 2017-03-06 MED ORDER — LOSARTAN POTASSIUM 100 MG PO TABS: 100.0000 mg | ORAL_TABLET | Freq: Every day | ORAL | 0 refills | Status: DC

## 2017-03-06 NOTE — Telephone Encounter (Signed)
Please advise for recall?

## 2017-03-12 ENCOUNTER — Ambulatory Visit: Payer: BLUE CROSS/BLUE SHIELD | Admitting: Internal Medicine

## 2017-03-12 DIAGNOSIS — R197 Diarrhea, unspecified: Secondary | ICD-10-CM | POA: Diagnosis not present

## 2017-03-14 DIAGNOSIS — R197 Diarrhea, unspecified: Secondary | ICD-10-CM | POA: Diagnosis not present

## 2017-03-15 DIAGNOSIS — N528 Other male erectile dysfunction: Secondary | ICD-10-CM | POA: Diagnosis not present

## 2017-03-15 DIAGNOSIS — I10 Essential (primary) hypertension: Secondary | ICD-10-CM | POA: Diagnosis not present

## 2017-03-15 DIAGNOSIS — E782 Mixed hyperlipidemia: Secondary | ICD-10-CM | POA: Diagnosis not present

## 2017-03-19 DIAGNOSIS — K589 Irritable bowel syndrome without diarrhea: Secondary | ICD-10-CM | POA: Diagnosis not present

## 2017-03-19 DIAGNOSIS — Z1211 Encounter for screening for malignant neoplasm of colon: Secondary | ICD-10-CM | POA: Diagnosis not present

## 2017-03-26 ENCOUNTER — Other Ambulatory Visit: Payer: Self-pay | Admitting: Internal Medicine

## 2017-03-29 DIAGNOSIS — Z6834 Body mass index (BMI) 34.0-34.9, adult: Secondary | ICD-10-CM | POA: Diagnosis not present

## 2017-03-29 DIAGNOSIS — I10 Essential (primary) hypertension: Secondary | ICD-10-CM | POA: Diagnosis not present

## 2017-03-29 DIAGNOSIS — E7800 Pure hypercholesterolemia, unspecified: Secondary | ICD-10-CM | POA: Diagnosis not present

## 2017-04-08 DIAGNOSIS — M25511 Pain in right shoulder: Secondary | ICD-10-CM | POA: Diagnosis not present

## 2017-04-10 DIAGNOSIS — I10 Essential (primary) hypertension: Secondary | ICD-10-CM | POA: Diagnosis not present

## 2017-04-17 DIAGNOSIS — N201 Calculus of ureter: Secondary | ICD-10-CM | POA: Diagnosis not present

## 2017-05-17 DIAGNOSIS — E785 Hyperlipidemia, unspecified: Secondary | ICD-10-CM | POA: Insufficient documentation

## 2017-05-17 DIAGNOSIS — I10 Essential (primary) hypertension: Secondary | ICD-10-CM | POA: Diagnosis not present

## 2017-06-28 DIAGNOSIS — E782 Mixed hyperlipidemia: Secondary | ICD-10-CM | POA: Diagnosis not present

## 2017-06-28 DIAGNOSIS — I10 Essential (primary) hypertension: Secondary | ICD-10-CM | POA: Diagnosis not present

## 2017-06-28 DIAGNOSIS — N528 Other male erectile dysfunction: Secondary | ICD-10-CM | POA: Diagnosis not present

## 2017-06-28 DIAGNOSIS — Z6835 Body mass index (BMI) 35.0-35.9, adult: Secondary | ICD-10-CM | POA: Diagnosis not present

## 2017-09-13 ENCOUNTER — Other Ambulatory Visit: Payer: Self-pay | Admitting: Internal Medicine

## 2017-09-13 DIAGNOSIS — E782 Mixed hyperlipidemia: Secondary | ICD-10-CM

## 2017-09-16 NOTE — Telephone Encounter (Signed)
Last OV: 09/12/2016 Next OV: not scheduled

## 2017-10-09 ENCOUNTER — Other Ambulatory Visit: Payer: Self-pay | Admitting: Internal Medicine

## 2017-11-22 ENCOUNTER — Other Ambulatory Visit: Payer: Self-pay | Admitting: Internal Medicine

## 2017-11-22 DIAGNOSIS — E782 Mixed hyperlipidemia: Secondary | ICD-10-CM

## 2017-12-20 DIAGNOSIS — I10 Essential (primary) hypertension: Secondary | ICD-10-CM | POA: Diagnosis not present

## 2017-12-20 DIAGNOSIS — N528 Other male erectile dysfunction: Secondary | ICD-10-CM | POA: Diagnosis not present

## 2017-12-20 DIAGNOSIS — E782 Mixed hyperlipidemia: Secondary | ICD-10-CM | POA: Diagnosis not present

## 2017-12-20 DIAGNOSIS — Z6835 Body mass index (BMI) 35.0-35.9, adult: Secondary | ICD-10-CM | POA: Diagnosis not present

## 2018-09-12 DIAGNOSIS — I10 Essential (primary) hypertension: Secondary | ICD-10-CM | POA: Diagnosis not present

## 2018-09-12 DIAGNOSIS — E782 Mixed hyperlipidemia: Secondary | ICD-10-CM | POA: Diagnosis not present

## 2018-09-12 DIAGNOSIS — Z6833 Body mass index (BMI) 33.0-33.9, adult: Secondary | ICD-10-CM | POA: Diagnosis not present

## 2018-09-12 DIAGNOSIS — N528 Other male erectile dysfunction: Secondary | ICD-10-CM | POA: Diagnosis not present

## 2018-09-18 DIAGNOSIS — H524 Presbyopia: Secondary | ICD-10-CM | POA: Diagnosis not present

## 2018-10-11 DIAGNOSIS — R6889 Other general symptoms and signs: Secondary | ICD-10-CM | POA: Diagnosis not present

## 2018-10-11 DIAGNOSIS — J101 Influenza due to other identified influenza virus with other respiratory manifestations: Secondary | ICD-10-CM | POA: Diagnosis not present

## 2018-10-11 DIAGNOSIS — I10 Essential (primary) hypertension: Secondary | ICD-10-CM | POA: Diagnosis not present

## 2019-03-13 DIAGNOSIS — E782 Mixed hyperlipidemia: Secondary | ICD-10-CM | POA: Diagnosis not present

## 2019-03-13 DIAGNOSIS — Z1159 Encounter for screening for other viral diseases: Secondary | ICD-10-CM | POA: Diagnosis not present

## 2019-03-13 DIAGNOSIS — I10 Essential (primary) hypertension: Secondary | ICD-10-CM | POA: Diagnosis not present

## 2019-03-13 DIAGNOSIS — Z6834 Body mass index (BMI) 34.0-34.9, adult: Secondary | ICD-10-CM | POA: Diagnosis not present

## 2019-06-17 DIAGNOSIS — K4090 Unilateral inguinal hernia, without obstruction or gangrene, not specified as recurrent: Secondary | ICD-10-CM | POA: Diagnosis not present

## 2019-06-17 DIAGNOSIS — R10814 Left lower quadrant abdominal tenderness: Secondary | ICD-10-CM | POA: Diagnosis not present

## 2019-06-18 DIAGNOSIS — R10814 Left lower quadrant abdominal tenderness: Secondary | ICD-10-CM | POA: Diagnosis not present

## 2019-06-18 DIAGNOSIS — R109 Unspecified abdominal pain: Secondary | ICD-10-CM | POA: Diagnosis not present

## 2019-09-18 ENCOUNTER — Other Ambulatory Visit: Payer: Self-pay

## 2019-09-18 ENCOUNTER — Ambulatory Visit: Payer: Commercial Managed Care - PPO | Admitting: Legal Medicine

## 2019-09-18 ENCOUNTER — Encounter: Payer: Self-pay | Admitting: Legal Medicine

## 2019-09-18 VITALS — BP 116/66 | HR 81 | Temp 98.2°F | Resp 16 | Ht 72.0 in | Wt 259.0 lb

## 2019-09-18 DIAGNOSIS — I10 Essential (primary) hypertension: Secondary | ICD-10-CM | POA: Diagnosis not present

## 2019-09-18 DIAGNOSIS — E782 Mixed hyperlipidemia: Secondary | ICD-10-CM | POA: Diagnosis not present

## 2019-09-18 DIAGNOSIS — G4733 Obstructive sleep apnea (adult) (pediatric): Secondary | ICD-10-CM

## 2019-09-18 NOTE — Progress Notes (Signed)
Established Patient Office Visit  Subjective:  Patient ID: Timothy Small, male    DOB: 04-23-1971  Age: 49 y.o. MRN: [id number]  CC:  Chief Complaint  Patient presents with  . Hypertension  . Hyperlipidemia    HPI Timothy Small presents for Chronic visit.  We reviewed his last CT abdomen and pelvis and gave copy  Patient presents for follow up of hypertension.  Patient tolerating amlodipine, HCTZ well with side effects.  Patient was diagnosed with hypertension 201010 so has been treated for hypertension for 10 years.Patient is working on maintaining diet and exercise regimen and follows up as directed.  Patient presents with hyperlipidemia.  Compliance with treatment has been good; patient takes medicines as directed, maintains low cholesterol diet, follows up as directed, and maintains exercise regimen.  Patient is using atorvastatin without problems.  Past Medical History:  Diagnosis Date  . COPD (chronic obstructive pulmonary disease) ([place])   . ED (erectile dysfunction)   . Hypertension   . OSA (obstructive sleep apnea)    s/p  osa surgery 2011-- sleep study repeated after surgery per pt stated mild osa no cpap  . Renal cyst 2010   multiple  . Right ureteral stone     Past Surgical History:  Procedure Laterality Date  . CYSTOSCOPY/RETROGRADE/URETEROSCOPY/STONE EXTRACTION WITH BASKET Right 12/23/2015   Procedure: CYSTOSCOPY/RIGHT RETROGRADE/ PYELOGRAM URETEROSCOPY/STONE EXTRACTION WITH BASKET;  Surgeon: Timothy Rhodes, MD;  Location: [facility];  Service: Urology;  Laterality: Right;  . UVULOPALATOPHARYNGOPLASTY  2011   and Tonsillectomy/ Adenoidectomy for sleep apnea    Family History  Problem Relation Age of Onset  . Cancer Father        bladder Ca   . Heart disease Maternal Aunt 45       heart failure  . Heart disease Maternal Uncle   . Heart disease Maternal Grandmother   . Heart disease Maternal Grandfather   . Hypertension Brother      Social History   Socioeconomic History  . Marital status: Married    Spouse name: Timothy Small  . Number of children: 2  . Years of education: Not on file  . Highest education level: Not on file  Occupational History    Employer: [employer]  Tobacco Use  . Smoking status: Never Smoker  . Smokeless tobacco: Current User    Types: Snuff  . Tobacco comment: 1 can every two days  Substance and Sexual Activity  . Alcohol use: Yes    Alcohol/week: 4.0 standard drinks    Types: 4 Cans of beer per week    Comment: socially  . Drug use: No  . Sexual activity: Yes  Other Topics Concern  . Not on file  Social History Narrative  . Not on file   Social Determinants of Health   Financial Resource Strain:   . Difficulty of Paying Living Expenses: Not on file  Food Insecurity:   . Worried About Charity fundraiser in the Last Year: Not on file  . Ran Out of Food in the Last Year: Not on file  Transportation Needs:   . Lack of Transportation (Medical): Not on file  . Lack of Transportation (Non-Medical): Not on file  Physical Activity:   . Days of Exercise per Week: Not on file  . Minutes of Exercise per Session: Not on file  Stress:   . Feeling of Stress : Not on file  Social Connections:   . Frequency of Communication with Friends and Family:  Not on file  . Frequency of Social Gatherings with Friends and Family: Not on file  . Attends Religious Services: Not on file  . Active Member of Clubs or Organizations: Not on file  . Attends Archivist Meetings: Not on file  . Marital Status: Not on file  Intimate Partner Violence:   . Fear of Current or Ex-Partner: Not on file  . Emotionally Abused: Not on file  . Physically Abused: Not on file  . Sexually Abused: Not on file    Outpatient Medications Prior to Visit  Medication Sig Dispense Refill  . amLODipine (NORVASC) 10 MG tablet TAKE 1 TABLET (10 MG TOTAL) BY MOUTH EVERY MORNING. 90 tablet 0  . atorvastatin (LIPITOR) 40  MG tablet TAKE 1 TABLET (40 MG TOTAL) BY MOUTH DAILY. 90 tablet 0  . hydrochlorothiazide (HYDRODIURIL) 25 MG tablet TAKE 1 TABLET (25 MG TOTAL) BY MOUTH DAILY. 90 tablet 0  . sildenafil (REVATIO) 20 MG tablet TAKE 1 TABLET (20 MG TOTAL) BY MOUTH 3 (THREE) TIMES DAILY. 30 tablet 10  . valsartan (DIOVAN) 320 MG tablet TAKE 1 TABLET BY MOUTH EVERY DAY 30 tablet 0  . albuterol (PROVENTIL HFA;VENTOLIN HFA) 108 (90 BASE) MCG/ACT inhaler Inhale 2 puffs into the lungs as needed for wheezing. (Patient not taking: Reported on 09/12/2016) 1 Inhaler 3  . losartan (COZAAR) 100 MG tablet Take 1 tablet (100 mg total) by mouth daily. (Patient not taking: Reported on 09/18/2019) 90 tablet 0  . meloxicam (MOBIC) 15 MG tablet TAKE 1 TABLET (15 MG TOTAL) BY MOUTH DAILY. (Patient not taking: Reported on 09/18/2019) 30 tablet 5  . Oxycodone HCl 10 MG TABS Take 1 tablet (10 mg total) by mouth every 4 (four) hours as needed. (Patient not taking: Reported on 09/12/2016) 10 tablet 0  . oxyCODONE-acetaminophen (PERCOCET/ROXICET) 5-325 MG tablet Take 1-2 tablets by mouth every 6 (six) hours as needed for severe pain. (Patient not taking: Reported on 09/12/2016) 30 tablet 0  . phenazopyridine (PYRIDIUM) 200 MG tablet Take 1 tablet (200 mg total) by mouth 3 (three) times daily as needed for pain. (Patient not taking: Reported on 09/12/2016) 10 tablet 0  . sildenafil (REVATIO) 20 MG tablet Take 20 mg by mouth 3 (three) times daily before meals.    . SYMBICORT 160-4.5 MCG/ACT inhaler INHALE TWO PUFFS BY MOUTH TWICE DAILY (Patient not taking: Reported on 09/12/2016) 10.2 Inhaler 12   No facility-administered medications prior to visit.    No Known Allergies  ROS Review of Systems  Constitutional: Negative.   HENT: Negative.   Eyes: Negative.   Respiratory: Negative.   Cardiovascular: Negative.   Gastrointestinal: Negative.   Endocrine: Negative.   Genitourinary: Negative.   Musculoskeletal: Negative.   Skin: Negative.    Neurological: Negative.   Psychiatric/Behavioral: Negative.       Objective:    Physical Exam  Constitutional: He is oriented to person, place, and time. He appears well-developed and well-nourished.  HENT:  Head: Normocephalic and atraumatic.  Eyes: Pupils are equal, round, and reactive to light. Conjunctivae and EOM are normal.  Cardiovascular: Normal rate, regular rhythm and normal heart sounds.  Abdominal: Soft. Bowel sounds are normal.  Musculoskeletal:        General: Normal range of motion.     Cervical back: Normal range of motion and neck supple.  Neurological: He is alert and oriented to person, place, and time. He has normal reflexes.  Skin: Skin is warm and dry.  Psychiatric: He has  a normal mood and affect.    BP 116/66 (BP Location: Right Arm, Patient Position: Sitting)   Pulse 81   Temp 98.2 F (36.8 C) (Temporal)   Resp 16   Ht 6' (1.829 m)   Wt 259 lb (117.5 kg)   SpO2 98%   BMI 35.13 kg/m  Wt Readings from Last 3 Encounters:  09/18/19 259 lb (117.5 kg)  09/12/16 254 lb (115.2 kg)  12/23/15 234 lb (106.1 kg)     Health Maintenance Due  Topic Date Due  . HIV Screening  02/24/1986  . TETANUS/TDAP  09/23/2018  . INFLUENZA VACCINE  03/14/2019    There are no preventive care reminders to display for this patient.  Lab Results  Component Value Date   TSH 1.47 09/12/2016   Lab Results  Component Value Date   WBC 7.0 09/18/2019   HGB 17.4 09/18/2019   HCT 49.6 09/18/2019   MCV 86 09/18/2019   PLT 231 09/18/2019   Lab Results  Component Value Date   NA 143 09/18/2019   K 4.4 09/18/2019   CO2 24 09/18/2019   GLUCOSE 94 09/18/2019   BUN 15 09/18/2019   CREATININE 1.11 09/18/2019   BILITOT 0.5 09/18/2019   ALKPHOS 87 09/18/2019   AST 21 09/18/2019   ALT 47 (H) 09/18/2019   PROT 7.0 09/18/2019   ALBUMIN 4.8 09/18/2019   CALCIUM 10.0 09/18/2019   GFR 78.39 09/12/2016   Lab Results  Component Value Date   CHOL 161 09/18/2019   Lab  Results  Component Value Date   HDL 43 09/18/2019   Lab Results  Component Value Date   LDLCALC 90 09/18/2019   Lab Results  Component Value Date   TRIG 160 (H) 09/18/2019   Lab Results  Component Value Date   CHOLHDL 3.7 09/18/2019   Lab Results  Component Value Date   HGBA1C 5.3 09/12/2016      Assessment & Plan:   Problem List Items Addressed This Visit      Cardiovascular and Mediastinum   Essential hypertension - Primary    An individual care plan was established and reinforced today.  The patient's status was assessed using clinical findings on exam and labs or diagnostic tests. The patient's success at meeting treatment goals on disease specific evidence-based guidelines and found to be well controlled. SELF MANAGEMENT: The patient and I together assessed ways to personally work towards obtaining the recommended goals. RECOMMENDATIONS: avoid decongestants found in common cold remedies, decrease consumption of alcohol, perform routine monitoring of BP with home BP cuff, exercise, reduction of dietary salt, take medicines as prescribed, try not to miss doses and quit smoking.  Regular exercise and maintaining a healthy weight is needed.  Stress reduction may help. A CLINICAL SUMMARY including written plan identify barriers to care unique to individual due to social or financial issues.  We attempt to mutually creat solutions for individual and family understanding.      Relevant Medications   sildenafil (REVATIO) 20 MG tablet   Other Relevant Orders   CBC with Differential (Completed)   Comprehensive metabolic panel (Completed)     Respiratory   Sleep apnea, obstructive    AN INDIVIDUAL CARE PLAN was established and reinforced today.  The patient's status was assessed using clinical findings on exam, labs, and other diagnostic testing. Patient's success at meeting treatment goals based on disease specific evidence-bassed guidelines and found to be in good control. He is  using his CPAP regularly. RECOMMENDATIONS include  maintaining present medicines and treatment.        Other   HLD (hyperlipidemia)    AN INDIVIDUAL CARE PLAN was established and reinforced today.  The patient's status was assessed using clinical findings on exam, lab and other diagnostic tests. The patient's disease status was assessed based on evidence-based guidelines and found to be good controlled. MEDICATIONS were reviewed. SELF MANAGEMENT GOALS have been discussed and patient's success at attaining the goal of low cholesterol was assessed. RECOMMENDATION given include regular exercise 3 days a week and low cholesterol/low fat diet. CLINICAL SUMMARY including written plan to identify barriers unique to the patient due to social or economic  reasons was discussed.      Relevant Medications   sildenafil (REVATIO) 20 MG tablet   Other Relevant Orders   Lipid Panel (Completed)      No orders of the defined types were placed in this encounter.   Follow-up: Return in about 6 months (around 03/17/2020) for fasting.    Reinaldo Meeker, MD

## 2019-09-18 NOTE — Patient Instructions (Signed)

## 2019-09-19 LAB — LIPID PANEL
Chol/HDL Ratio: 3.7 ratio (ref 0.0–5.0)
Cholesterol, Total: 161 mg/dL (ref 100–199)
HDL: 43 mg/dL (ref >39)
LDL Chol Calc (NIH): 90 mg/dL (ref 0–99)
Triglycerides: 160 mg/dL — ABNORMAL HIGH (ref 0–149)
VLDL Cholesterol Cal: 28 mg/dL (ref 5–40)

## 2019-09-19 LAB — CBC WITH DIFFERENTIAL/PLATELET
Basophils Absolute: 0.1 10*3/uL (ref 0.0–0.2)
Basos: 1 %
EOS (ABSOLUTE): 0.1 10*3/uL (ref 0.0–0.4)
Eos: 1 %
Hematocrit: 49.6 % (ref 37.5–51.0)
Hemoglobin: 17.4 g/dL (ref 13.0–17.7)
Immature Grans (Abs): 0 10*3/uL (ref 0.0–0.1)
Immature Granulocytes: 0 %
Lymphocytes Absolute: 1.3 10*3/uL (ref 0.7–3.1)
Lymphs: 18 %
MCH: 30.3 pg (ref 26.6–33.0)
MCHC: 35.1 g/dL (ref 31.5–35.7)
MCV: 86 fL (ref 79–97)
Monocytes Absolute: 0.6 10*3/uL (ref 0.1–0.9)
Monocytes: 9 %
Neutrophils Absolute: 4.9 10*3/uL (ref 1.4–7.0)
Neutrophils: 71 %
Platelets: 231 10*3/uL (ref 150–450)
RBC: 5.74 x10E6/uL (ref 4.14–5.80)
RDW: 13.2 % (ref 11.6–15.4)
WBC: 7 10*3/uL (ref 3.4–10.8)

## 2019-09-19 LAB — COMPREHENSIVE METABOLIC PANEL
ALT: 47 IU/L — ABNORMAL HIGH (ref 0–44)
AST: 21 IU/L (ref 0–40)
Albumin/Globulin Ratio: 2.2 (ref 1.2–2.2)
Albumin: 4.8 g/dL (ref 4.0–5.0)
Alkaline Phosphatase: 87 IU/L (ref 39–117)
BUN/Creatinine Ratio: 14 (ref 9–20)
BUN: 15 mg/dL (ref 6–24)
Bilirubin Total: 0.5 mg/dL (ref 0.0–1.2)
CO2: 24 mmol/L (ref 20–29)
Calcium: 10 mg/dL (ref 8.7–10.2)
Chloride: 102 mmol/L (ref 96–106)
Creatinine, Ser: 1.11 mg/dL (ref 0.76–1.27)
GFR calc Af Amer: 90 mL/min/{1.73_m2} (ref >59)
GFR calc non Af Amer: 78 mL/min/{1.73_m2} (ref >59)
Globulin, Total: 2.2 g/dL (ref 1.5–4.5)
Glucose: 94 mg/dL (ref 65–99)
Potassium: 4.4 mmol/L (ref 3.5–5.2)
Sodium: 143 mmol/L (ref 134–144)
Total Protein: 7 g/dL (ref 6.0–8.5)

## 2019-09-19 LAB — CARDIOVASCULAR RISK ASSESSMENT

## 2019-09-19 NOTE — Assessment & Plan Note (Signed)

## 2019-09-19 NOTE — Assessment & Plan Note (Signed)
AN INDIVIDUAL CARE PLAN was established and reinforced today.  The patient's status was assessed using clinical findings on exam, lab and other diagnostic tests. The patient's disease status was assessed based on evidence-based guidelines and found to be good controlled. MEDICATIONS were reviewed. SELF MANAGEMENT GOALS have been discussed and patient's success at attaining the goal of low cholesterol was assessed. RECOMMENDATION given include regular exercise 3 days a week and low cholesterol/low fat diet. CLINICAL SUMMARY including written plan to identify barriers unique to the patient due to social or economic  reasons was discussed. 

## 2019-09-19 NOTE — Assessment & Plan Note (Signed)
AN INDIVIDUAL CARE PLAN was established and reinforced today.  The patient's status was assessed using clinical findings on exam, labs, and other diagnostic testing. Patient's success at meeting treatment goals based on disease specific evidence-bassed guidelines and found to be in good control. He is using his CPAP regularly. RECOMMENDATIONS include maintaining present medicines and treatment.

## 2019-09-20 NOTE — Progress Notes (Signed)
No anemia, CBC normal, Kidney tests normal, one liver test slightly elevated, Triglycerides still high, watch cholesterol diet,

## 2019-09-21 ENCOUNTER — Telehealth: Payer: Self-pay

## 2019-09-21 NOTE — Telephone Encounter (Signed)
-----   Message from Lillard Anes, MD sent at 09/20/2019 12:22 PM EST ----- No anemia, CBC normal, Kidney tests normal, one liver test slightly elevated, Triglycerides still high, watch cholesterol diet,

## 2019-09-22 ENCOUNTER — Telehealth: Payer: Self-pay | Admitting: Legal Medicine

## 2019-09-22 NOTE — Telephone Encounter (Signed)
Called x 1 vm to call office for lab results, Number is [phone]

## 2019-09-22 NOTE — Telephone Encounter (Signed)
Patient called asking for the results of his lasts Labs.  Please call him at [phone].Marland KitchenMarland KitchenBenjamin

## 2019-09-24 NOTE — Telephone Encounter (Signed)
Called x 2 no left voicemail to call office for lab results.

## 2019-09-29 NOTE — Telephone Encounter (Signed)
Send lab results in mail

## 2019-11-20 ENCOUNTER — Other Ambulatory Visit: Payer: Self-pay | Admitting: Legal Medicine

## 2019-12-26 ENCOUNTER — Other Ambulatory Visit: Payer: Self-pay | Admitting: Legal Medicine

## 2020-03-18 ENCOUNTER — Encounter: Payer: Self-pay | Admitting: Legal Medicine

## 2020-03-18 ENCOUNTER — Ambulatory Visit (INDEPENDENT_AMBULATORY_CARE_PROVIDER_SITE_OTHER): Payer: Commercial Managed Care - PPO | Admitting: Legal Medicine

## 2020-03-18 ENCOUNTER — Other Ambulatory Visit: Payer: Self-pay

## 2020-03-18 VITALS — BP 134/86 | HR 75 | Temp 98.2°F | Resp 16 | Ht 72.0 in | Wt 256.4 lb

## 2020-03-18 DIAGNOSIS — I10 Essential (primary) hypertension: Secondary | ICD-10-CM

## 2020-03-18 DIAGNOSIS — E782 Mixed hyperlipidemia: Secondary | ICD-10-CM

## 2020-03-18 DIAGNOSIS — G4733 Obstructive sleep apnea (adult) (pediatric): Secondary | ICD-10-CM

## 2020-03-18 DIAGNOSIS — Z6834 Body mass index (BMI) 34.0-34.9, adult: Secondary | ICD-10-CM | POA: Diagnosis not present

## 2020-03-18 MED ORDER — ATORVASTATIN CALCIUM 40 MG PO TABS: 40.0000 mg | ORAL_TABLET | Freq: Every day | ORAL | 2 refills | Status: DC

## 2020-03-18 MED ORDER — AMLODIPINE BESYLATE 10 MG PO TABS: 10.0000 mg | ORAL_TABLET | Freq: Every morning | ORAL | 2 refills | Status: DC

## 2020-03-18 MED ORDER — VALSARTAN 320 MG PO TABS: 320.0000 mg | ORAL_TABLET | Freq: Every day | ORAL | 2 refills | Status: DC

## 2020-03-18 NOTE — Progress Notes (Signed)
Subjective:  Patient ID: Timothy Small, male    DOB: 07-11-1971  Age: 49 y.o. MRN: [id number]  Chief Complaint  Patient presents with  . Hypertension  . Hyperlipidemia    HPI: chronic visit  Patient presents for follow up of hypertension.  Patient tolerating valsartin well with side effects.  Patient was diagnosed with hypertension 2010 so has been treated for hypertension for 10 years.Patient is working on maintaining diet and exercise regimen and follows up as directed. Complication include none  Patient presents with hyperlipidemia.  Compliance with treatment has been good; patient takes medicines as directed, maintains low cholesterol diet, follows up as directed, and maintains exercise regimen.  Patient is using atorvastatin without problems..   Current Outpatient Medications on File Prior to Visit  Medication Sig Dispense Refill  . hydrochlorothiazide (HYDRODIURIL) 25 MG tablet TAKE 1 TABLET (25 MG TOTAL) BY MOUTH DAILY. 90 tablet 0  . sildenafil (REVATIO) 20 MG tablet TAKE 1 TABLET (20 MG TOTAL) BY MOUTH 3 (THREE) TIMES DAILY. (Patient not taking: Reported on 03/18/2020) 30 tablet 10   No current facility-administered medications on file prior to visit.   Past Medical History:  Diagnosis Date  . COPD (chronic obstructive pulmonary disease) ([place])   . ED (erectile dysfunction)   . Hypertension   . OSA (obstructive sleep apnea)    s/p  osa surgery 2011-- sleep study repeated after surgery per pt stated mild osa no cpap  . Renal cyst 2010   multiple  . Right ureteral stone    Past Surgical History:  Procedure Laterality Date  . CYSTOSCOPY/RETROGRADE/URETEROSCOPY/STONE EXTRACTION WITH BASKET Right 12/23/2015   Procedure: CYSTOSCOPY/RIGHT RETROGRADE/ PYELOGRAM URETEROSCOPY/STONE EXTRACTION WITH BASKET;  Surgeon: Kathie Rhodes, MD;  Location: [facility];  Service: Urology;  Laterality: Right;  . UVULOPALATOPHARYNGOPLASTY  2011   and Tonsillectomy/  Adenoidectomy for sleep apnea    Family History  Problem Relation Age of Onset  . Cancer Father        bladder Ca   . Heart disease Maternal Aunt 45       heart failure  . Heart disease Maternal Uncle   . Heart disease Maternal Grandmother   . Heart disease Maternal Grandfather   . Hypertension Brother    Social History   Socioeconomic History  . Marital status: Married    Spouse name: [name]  . Number of children: 2  . Years of education: Not on file  . Highest education level: Not on file  Occupational History    Employer: [employer]  Tobacco Use  . Smoking status: Never Smoker  . Smokeless tobacco: Current User    Types: Snuff  . Tobacco comment: 1 can every two days  Substance and Sexual Activity  . Alcohol use: Yes    Alcohol/week: 4.0 standard drinks    Types: 4 Cans of beer per week    Comment: socially  . Drug use: No  . Sexual activity: Yes  Other Topics Concern  . Not on file  Social History Narrative  . Not on file   Social Determinants of Health   Financial Resource Strain:   . Difficulty of Paying Living Expenses:   Food Insecurity:   . Worried About Charity fundraiser in the Last Year:   . Arboriculturist in the Last Year:   Transportation Needs:   . Film/video editor (Medical):   Marland Kitchen Lack of Transportation (Non-Medical):   Physical Activity:   . Days of Exercise  per Week:   . Minutes of Exercise per Session:   Stress:   . Feeling of Stress :   Social Connections:   . Frequency of Communication with Friends and Family:   . Frequency of Social Gatherings with Friends and Family:   . Attends Religious Services:   . Active Member of Clubs or Organizations:   . Attends Archivist Meetings:   Marland Kitchen Marital Status:     Review of Systems  Constitutional: Negative.   HENT: Negative.   Eyes: Negative.   Respiratory: Negative.   Cardiovascular: Negative.   Gastrointestinal: Negative.   Genitourinary: Negative.   Musculoskeletal:  Negative.   Skin: Negative.   Neurological: Negative.   Psychiatric/Behavioral: Negative.      Objective:  BP 134/86   Pulse 75   Temp 98.2 F (36.8 C)   Resp 16   Ht 6' (1.829 m)   Wt 256 lb 6.4 oz (116.3 kg)   SpO2 97%   BMI 34.77 kg/m   BP/Weight 03/18/2020 09/18/2019 4/85/4627  Systolic BP 035 009 381  Diastolic BP 86 66 86  Wt. (Lbs) 256.4 259 254  BMI 34.77 35.13 34.45    Physical Exam Vitals reviewed.  Constitutional:      Appearance: Normal appearance.  HENT:     Head: Normocephalic and atraumatic.     Right Ear: Tympanic membrane, ear canal and external ear normal.     Left Ear: Tympanic membrane, ear canal and external ear normal.     Nose: Nose normal.     Mouth/Throat:     Mouth: Mucous membranes are moist.  Eyes:     Extraocular Movements: Extraocular movements intact.     Conjunctiva/sclera: Conjunctivae normal.     Pupils: Pupils are equal, round, and reactive to light.  Cardiovascular:     Rate and Rhythm: Normal rate and regular rhythm.     Pulses: Normal pulses.     Heart sounds: Normal heart sounds.  Pulmonary:     Effort: Pulmonary effort is normal.     Breath sounds: Normal breath sounds.  Abdominal:     General: Abdomen is flat. Bowel sounds are normal.     Palpations: Abdomen is soft.  Musculoskeletal:        General: Normal range of motion.     Cervical back: Normal range of motion and neck supple.  Skin:    General: Skin is warm and dry.     Capillary Refill: Capillary refill takes less than 2 seconds.  Neurological:     General: No focal deficit present.     Mental Status: He is alert and oriented to person, place, and time.  Psychiatric:        Mood and Affect: Mood normal.        Thought Content: Thought content normal.        Judgment: Judgment normal.     Diabetic Foot Exam - Simple   No data filed       Lab Results  Component Value Date   WBC 5.8 03/18/2020   HGB 16.5 03/18/2020   HCT 48.9 03/18/2020   PLT 182  03/18/2020   GLUCOSE 98 03/18/2020   CHOL 154 03/18/2020   TRIG 118 03/18/2020   HDL 41 03/18/2020   LDLDIRECT 77.0 09/12/2016   LDLCALC 92 03/18/2020   ALT 35 03/18/2020   AST 16 03/18/2020   NA 143 03/18/2020   K 4.3 03/18/2020   CL 102 03/18/2020   CREATININE 1.02  03/18/2020   BUN 13 03/18/2020   CO2 25 03/18/2020   TSH 1.47 09/12/2016   HGBA1C 5.3 09/12/2016      Assessment & Plan:   1. Essential hypertension - valsartan (DIOVAN) 320 MG tablet; Take 1 tablet (320 mg total) by mouth daily.  Dispense: 90 tablet; Refill: 2 - amLODipine (NORVASC) 10 MG tablet; Take 1 tablet (10 mg total) by mouth every morning.  Dispense: 90 tablet; Refill: 2 - CBC with Differential/Platelet - Comprehensive metabolic panel An individual hypertension care plan was established and reinforced today.  The patient's status was assessed using clinical findings on exam and labs or diagnostic tests. The patient's success at meeting treatment goals on disease specific evidence-based guidelines and found to be well controlled. SELF MANAGEMENT: The patient and I together assessed ways to personally work towards obtaining the recommended goals. RECOMMENDATIONS: avoid decongestants found in common cold remedies, decrease consumption of alcohol, perform routine monitoring of BP with home BP cuff, exercise, reduction of dietary salt, take medicines as prescribed, try not to miss doses and quit smoking.  Regular exercise and maintaining a healthy weight is needed.  Stress reduction may help. A CLINICAL SUMMARY including written plan identify barriers to care unique to individual due to social or financial issues.  We attempt to mutually creat solutions for individual and family understanding.  2. Sleep apnea, obstructive AN INDIVIDUAL CARE PLAN for OSA was established and reinforced today.  The patient's status was assessed using clinical findings on exam, labs, and other diagnostic testing. Patient's success at  meeting treatment goals based on disease specific evidence-bassed guidelines and found to be in fair control. RECOMMENDATIONS include maintaining present medicines and treatment.  3. Mixed hyperlipidemia - atorvastatin (LIPITOR) 40 MG tablet; Take 1 tablet (40 mg total) by mouth daily.  Dispense: 90 tablet; Refill: 2 - Lipid panel AN INDIVIDUAL CARE PLAN for hyperlipidemia/ cholesterol was established and reinforced today.  The patient's status was assessed using clinical findings on exam, lab and other diagnostic tests. The patient's disease status was assessed based on evidence-based guidelines and found to be well controlled. MEDICATIONS were reviewed. SELF MANAGEMENT GOALS have been discussed and patient's success at attaining the goal of low cholesterol was assessed. RECOMMENDATION given include regular exercise 3 days a week and low cholesterol/low fat diet. CLINICAL SUMMARY including written plan to identify barriers unique to the patient due to social or economic  reasons was discussed. 4. BMI 34.0-34.9,adult An individualize plan was formulated for obesity using patient history and physical exam to encourage weight loss.  An evidence based program was formulated.  Patient is to cut portion swize with meals and to plan physical exercise 3 days a week at least 20 minutes.  Weight watchers and other programs are helpful.  Planned amount of weight loss 10 lbs.    Meds ordered this encounter  Medications  . valsartan (DIOVAN) 320 MG tablet    Sig: Take 1 tablet (320 mg total) by mouth daily.    Dispense:  90 tablet    Refill:  2  . atorvastatin (LIPITOR) 40 MG tablet    Sig: Take 1 tablet (40 mg total) by mouth daily.    Dispense:  90 tablet    Refill:  2  . amLODipine (NORVASC) 10 MG tablet    Sig: Take 1 tablet (10 mg total) by mouth every morning.    Dispense:  90 tablet    Refill:  2    Orders Placed This Encounter  Procedures  .  CBC with Differential/Platelet  .  Comprehensive metabolic panel  . Lipid panel  . Cardiovascular Risk Assessment     Follow-up: Return in about 6 months (around 09/18/2020) for fasting.  An After Visit Summary was printed and given to the patient.  [place] [phone]

## 2020-03-19 LAB — LIPID PANEL
Chol/HDL Ratio: 3.8 ratio (ref 0.0–5.0)
Cholesterol, Total: 154 mg/dL (ref 100–199)
HDL: 41 mg/dL (ref >39)
LDL Chol Calc (NIH): 92 mg/dL (ref 0–99)
Triglycerides: 118 mg/dL (ref 0–149)
VLDL Cholesterol Cal: 21 mg/dL (ref 5–40)

## 2020-03-19 LAB — CBC WITH DIFFERENTIAL/PLATELET
Basophils Absolute: 0 10*3/uL (ref 0.0–0.2)
Basos: 1 %
EOS (ABSOLUTE): 0.1 10*3/uL (ref 0.0–0.4)
Eos: 2 %
Hematocrit: 48.9 % (ref 37.5–51.0)
Hemoglobin: 16.5 g/dL (ref 13.0–17.7)
Immature Grans (Abs): 0 10*3/uL (ref 0.0–0.1)
Immature Granulocytes: 0 %
Lymphocytes Absolute: 1 10*3/uL (ref 0.7–3.1)
Lymphs: 18 %
MCH: 29.7 pg (ref 26.6–33.0)
MCHC: 33.7 g/dL (ref 31.5–35.7)
MCV: 88 fL (ref 79–97)
Monocytes Absolute: 0.5 10*3/uL (ref 0.1–0.9)
Monocytes: 8 %
Neutrophils Absolute: 4.1 10*3/uL (ref 1.4–7.0)
Neutrophils: 71 %
Platelets: 182 10*3/uL (ref 150–450)
RBC: 5.56 x10E6/uL (ref 4.14–5.80)
RDW: 13 % (ref 11.6–15.4)
WBC: 5.8 10*3/uL (ref 3.4–10.8)

## 2020-03-19 LAB — COMPREHENSIVE METABOLIC PANEL
ALT: 35 IU/L (ref 0–44)
AST: 16 IU/L (ref 0–40)
Albumin/Globulin Ratio: 2 (ref 1.2–2.2)
Albumin: 4.5 g/dL (ref 4.0–5.0)
Alkaline Phosphatase: 83 IU/L (ref 48–121)
BUN/Creatinine Ratio: 13 (ref 9–20)
BUN: 13 mg/dL (ref 6–24)
Bilirubin Total: 0.4 mg/dL (ref 0.0–1.2)
CO2: 25 mmol/L (ref 20–29)
Calcium: 9.6 mg/dL (ref 8.7–10.2)
Chloride: 102 mmol/L (ref 96–106)
Creatinine, Ser: 1.02 mg/dL (ref 0.76–1.27)
GFR calc Af Amer: 99 mL/min/{1.73_m2} (ref >59)
GFR calc non Af Amer: 86 mL/min/{1.73_m2} (ref >59)
Globulin, Total: 2.2 g/dL (ref 1.5–4.5)
Glucose: 98 mg/dL (ref 65–99)
Potassium: 4.3 mmol/L (ref 3.5–5.2)
Sodium: 143 mmol/L (ref 134–144)
Total Protein: 6.7 g/dL (ref 6.0–8.5)

## 2020-03-19 LAB — CARDIOVASCULAR RISK ASSESSMENT

## 2020-03-20 NOTE — Progress Notes (Signed)
CBC normal, kidney and liver tests normal, Cholesterol normal, triglycerides slightly high- watch diet,  lp

## 2020-03-25 ENCOUNTER — Other Ambulatory Visit: Payer: Self-pay | Admitting: Legal Medicine

## 2020-09-19 ENCOUNTER — Ambulatory Visit: Payer: Commercial Managed Care - PPO | Admitting: Legal Medicine

## 2020-09-19 ENCOUNTER — Other Ambulatory Visit: Payer: Self-pay

## 2020-09-19 ENCOUNTER — Encounter: Payer: Self-pay | Admitting: Legal Medicine

## 2020-09-19 VITALS — BP 138/86 | HR 92 | Temp 97.9°F | Ht 71.0 in | Wt 251.0 lb

## 2020-09-19 DIAGNOSIS — E782 Mixed hyperlipidemia: Secondary | ICD-10-CM

## 2020-09-19 DIAGNOSIS — J449 Chronic obstructive pulmonary disease, unspecified: Secondary | ICD-10-CM

## 2020-09-19 DIAGNOSIS — G4733 Obstructive sleep apnea (adult) (pediatric): Secondary | ICD-10-CM | POA: Diagnosis not present

## 2020-09-19 DIAGNOSIS — I10 Essential (primary) hypertension: Secondary | ICD-10-CM

## 2020-09-19 HISTORY — DX: Chronic obstructive pulmonary disease, unspecified: J44.9

## 2020-09-19 NOTE — Progress Notes (Signed)
Subjective:  Patient ID: Timothy Small, male    DOB: 1971-07-16  Age: 50 y.o. MRN: [id number]  Chief Complaint  Patient presents with  . Hyperlipidemia  . Hypertension    HPI: chronic visit  Patient presents for follow up of hypertension.  Patient tolerating hctz, amodipine , valsartan well with side effects.  Patient was diagnosed with hypertension 2010 so has been treated for hypertension for 10 years.Patient is working on maintaining diet and exercise regimen and follows up as directed. Complication include none. Some dizziness standing up.  Patient presents with hyperlipidemia.  Compliance with treatment has been good; patient takes medicines as directed, maintains low cholesterol diet, follows up as directed, and maintains exercise regimen.  Patient is using atorvastatin without problems.   Current Outpatient Medications on File Prior to Visit  Medication Sig Dispense Refill  . amLODipine (NORVASC) 10 MG tablet Take 1 tablet (10 mg total) by mouth every morning. 90 tablet 2  . atorvastatin (LIPITOR) 40 MG tablet Take 1 tablet (40 mg total) by mouth daily. 90 tablet 2  . hydrochlorothiazide (HYDRODIURIL) 25 MG tablet TAKE 1 TABLET BY MOUTH EVERY DAY 90 tablet 2  . sildenafil (REVATIO) 20 MG tablet TAKE 1 TABLET (20 MG TOTAL) BY MOUTH 3 (THREE) TIMES DAILY. (Patient not taking: Reported on 03/18/2020) 30 tablet 10  . valsartan (DIOVAN) 320 MG tablet Take 1 tablet (320 mg total) by mouth daily. 90 tablet 2   No current facility-administered medications on file prior to visit.   Past Medical History:  Diagnosis Date  . COPD (chronic obstructive pulmonary disease) (HCC)   . ED (erectile dysfunction)   . Hypertension   . OSA (obstructive sleep apnea)    s/p  osa surgery 2011-- sleep study repeated after surgery per pt stated mild osa no cpap  . Renal cyst 2010   multiple  . Right ureteral stone    Past Surgical History:  Procedure Laterality Date  .  CYSTOSCOPY/RETROGRADE/URETEROSCOPY/STONE EXTRACTION WITH BASKET Right 12/23/2015   Procedure: CYSTOSCOPY/RIGHT RETROGRADE/ PYELOGRAM URETEROSCOPY/STONE EXTRACTION WITH BASKET;  Surgeon: Ihor Gully, MD;  Location: [facility];  Service: Urology;  Laterality: Right;  . UVULOPALATOPHARYNGOPLASTY  2011   and Tonsillectomy/ Adenoidectomy for sleep apnea    Family History  Problem Relation Age of Onset  . Cancer Father        bladder Ca   . Heart disease Maternal Aunt 45       heart failure  . Heart disease Maternal Uncle   . Heart disease Maternal Grandmother   . Heart disease Maternal Grandfather   . Hypertension Brother    Social History   Socioeconomic History  . Marital status: Married    Spouse name: [name]  . Number of children: 2  . Years of education: Not on file  . Highest education level: Not on file  Occupational History    Employer: [employer]  Tobacco Use  . Smoking status: Never Smoker  . Smokeless tobacco: Current User    Types: Snuff  . Tobacco comment: 1 can every two days  Substance and Sexual Activity  . Alcohol use: Yes    Alcohol/week: 4.0 standard drinks    Types: 4 Cans of beer per week    Comment: socially  . Drug use: No  . Sexual activity: Yes  Other Topics Concern  . Not on file  Social History Narrative  . Not on file   Social Determinants of Health   Financial Resource Strain: Not  on file  Food Insecurity: Not on file  Transportation Needs: Not on file  Physical Activity: Not on file  Stress: Not on file  Social Connections: Not on file    Review of Systems  Constitutional: Negative for chills, diaphoresis, fatigue and fever.  HENT: Negative for congestion, ear pain and sore throat.   Respiratory: Negative for cough and shortness of breath.   Cardiovascular: Negative for chest pain and leg swelling.  Gastrointestinal: Negative for abdominal pain, constipation, diarrhea, nausea and vomiting.  Genitourinary: Negative for  dysuria and urgency.  Musculoskeletal: Negative for arthralgias and myalgias.  Neurological: Negative for dizziness and headaches.  Psychiatric/Behavioral: Negative for dysphoric mood.     Objective:  BP 138/86   Pulse 92   Temp 97.9 F (36.6 C)   Ht 5\' 11"  (1.803 m)   Wt 251 lb (113.9 kg)   SpO2 99%   BMI 35.01 kg/m   BP/Weight 09/19/2020 08/20/4079 11/15/8183  Systolic BP 631 497 026  Diastolic BP 86 86 66  Wt. (Lbs) 251 256.4 259  BMI 35.01 34.77 35.13    Physical Exam Vitals reviewed.  Constitutional:      Appearance: Normal appearance.  HENT:     Head: Normocephalic and atraumatic.     Right Ear: Tympanic membrane, ear canal and external ear normal.     Left Ear: Tympanic membrane, ear canal and external ear normal.     Mouth/Throat:     Mouth: Mucous membranes are dry.     Pharynx: Oropharynx is clear.  Eyes:     Extraocular Movements: Extraocular movements intact.     Conjunctiva/sclera: Conjunctivae normal.     Pupils: Pupils are equal, round, and reactive to light.  Cardiovascular:     Rate and Rhythm: Normal rate and regular rhythm.     Pulses: Normal pulses.     Heart sounds: Normal heart sounds.  Pulmonary:     Effort: Pulmonary effort is normal. No respiratory distress.     Breath sounds: Normal breath sounds. No rales.  Abdominal:     General: Abdomen is flat. Bowel sounds are normal. There is no distension.     Palpations: Abdomen is soft.     Tenderness: There is no abdominal tenderness.  Musculoskeletal:        General: Normal range of motion.  Skin:    General: Skin is warm and dry.     Capillary Refill: Capillary refill takes less than 2 seconds.  Neurological:     General: No focal deficit present.     Mental Status: He is alert and oriented to person, place, and time. Mental status is at baseline.  Psychiatric:        Mood and Affect: Mood normal.        Thought Content: Thought content normal.        Judgment: Judgment normal.        Lab Results  Component Value Date   WBC 5.8 03/18/2020   HGB 16.5 03/18/2020   HCT 48.9 03/18/2020   PLT 182 03/18/2020   GLUCOSE 98 03/18/2020   CHOL 154 03/18/2020   TRIG 118 03/18/2020   HDL 41 03/18/2020   LDLDIRECT 77.0 09/12/2016   LDLCALC 92 03/18/2020   ALT 35 03/18/2020   AST 16 03/18/2020   NA 143 03/18/2020   K 4.3 03/18/2020   CL 102 03/18/2020   CREATININE 1.02 03/18/2020   BUN 13 03/18/2020   CO2 25 03/18/2020   TSH 1.47 09/12/2016  HGBA1C 5.3 09/12/2016      Assessment & Plan:   Diagnoses and all orders for this visit: Chronic obstructive pulmonary disease, unspecified COPD type [facility]) An individualize plan was formulated for care of COPD.  Treatment is evidence based.  She will continue on inhalers, avoid smoking and smoke.  Regular exercise with help with dyspnea. Routine follow ups and medication compliance is needed.  Essential hypertension -     CBC with Differential/Platelet -     Comprehensive metabolic panel An individual hypertension care plan was established and reinforced today.  The patient's status was assessed using clinical findings on exam and labs or diagnostic tests. The patient's success at meeting treatment goals on disease specific evidence-based guidelines and found to be well controlled. SELF MANAGEMENT: The patient and I together assessed ways to personally work towards obtaining the recommended goals. RECOMMENDATIONS: avoid decongestants found in common cold remedies, decrease consumption of alcohol, perform routine monitoring of BP with home BP cuff, exercise, reduction of dietary salt, take medicines as prescribed, try not to miss doses and quit smoking.  Regular exercise and maintaining a healthy weight is needed.  Stress reduction may help. A CLINICAL SUMMARY including written plan identify barriers to care unique to individual due to social or financial issues.  We attempt to mutually creat solutions for individual and  family understanding. Sleep apnea, obstructive Patient has OSA but  Not on cpap, he had oral surgery.  Mixed hyperlipidemia -     Lipid panel AN INDIVIDUAL CARE PLAN for hyperlipidemia/ cholesterol was established and reinforced today.  The patient's status was assessed using clinical findings on exam, lab and other diagnostic tests. The patient's disease status was assessed based on evidence-based guidelines and found to be well controlled. MEDICATIONS were reviewed. SELF MANAGEMENT GOALS have been discussed and patient's success at attaining the goal of low cholesterol was assessed. RECOMMENDATION given include regular exercise 3 days a week and low cholesterol/low fat diet. CLINICAL SUMMARY including written plan to identify barriers unique to the patient due to social or economic  reasons was discussed.         I spent 30 minutes dedicated to the care of this patient on the date of this encounter to include face-to-face time with the patient, as well as: reviewed old records  Follow-up: Return in about 6 months (around 03/19/2021) for fasting.  An After Visit Summary was printed and given to the patient.  Reinaldo Meeker, MD [facility] [phone]

## 2020-09-20 LAB — COMPREHENSIVE METABOLIC PANEL
ALT: 27 IU/L (ref 0–44)
AST: 16 IU/L (ref 0–40)
Albumin/Globulin Ratio: 2.5 — ABNORMAL HIGH (ref 1.2–2.2)
Albumin: 4.9 g/dL (ref 4.0–5.0)
Alkaline Phosphatase: 74 IU/L (ref 44–121)
BUN/Creatinine Ratio: 16 (ref 9–20)
BUN: 19 mg/dL (ref 6–24)
Bilirubin Total: 0.4 mg/dL (ref 0.0–1.2)
CO2: 23 mmol/L (ref 20–29)
Calcium: 9.8 mg/dL (ref 8.7–10.2)
Chloride: 102 mmol/L (ref 96–106)
Creatinine, Ser: 1.19 mg/dL (ref 0.76–1.27)
GFR calc Af Amer: 82 mL/min/{1.73_m2} (ref >59)
GFR calc non Af Amer: 71 mL/min/{1.73_m2} (ref >59)
Globulin, Total: 2 g/dL (ref 1.5–4.5)
Glucose: 99 mg/dL (ref 65–99)
Potassium: 3.9 mmol/L (ref 3.5–5.2)
Sodium: 142 mmol/L (ref 134–144)
Total Protein: 6.9 g/dL (ref 6.0–8.5)

## 2020-09-20 LAB — CBC WITH DIFFERENTIAL/PLATELET
Basophils Absolute: 0 10*3/uL (ref 0.0–0.2)
Basos: 1 %
EOS (ABSOLUTE): 0.1 10*3/uL (ref 0.0–0.4)
Eos: 2 %
Hematocrit: 49.6 % (ref 37.5–51.0)
Hemoglobin: 17.1 g/dL (ref 13.0–17.7)
Immature Grans (Abs): 0 10*3/uL (ref 0.0–0.1)
Immature Granulocytes: 0 %
Lymphocytes Absolute: 1 10*3/uL (ref 0.7–3.1)
Lymphs: 20 %
MCH: 29.6 pg (ref 26.6–33.0)
MCHC: 34.5 g/dL (ref 31.5–35.7)
MCV: 86 fL (ref 79–97)
Monocytes Absolute: 0.5 10*3/uL (ref 0.1–0.9)
Monocytes: 11 %
Neutrophils Absolute: 3.2 10*3/uL (ref 1.4–7.0)
Neutrophils: 66 %
Platelets: 216 10*3/uL (ref 150–450)
RBC: 5.78 x10E6/uL (ref 4.14–5.80)
RDW: 12.7 % (ref 11.6–15.4)
WBC: 4.8 10*3/uL (ref 3.4–10.8)

## 2020-09-20 LAB — LIPID PANEL
Chol/HDL Ratio: 3.7 ratio (ref 0.0–5.0)
Cholesterol, Total: 132 mg/dL (ref 100–199)
HDL: 36 mg/dL — ABNORMAL LOW (ref >39)
LDL Chol Calc (NIH): 72 mg/dL (ref 0–99)
Triglycerides: 133 mg/dL (ref 0–149)
VLDL Cholesterol Cal: 24 mg/dL (ref 5–40)

## 2020-09-20 LAB — CARDIOVASCULAR RISK ASSESSMENT

## 2020-09-20 NOTE — Progress Notes (Signed)
Cbc normal, cholesterol normal, kidney and liver tests normal,  lp

## 2020-11-01 ENCOUNTER — Ambulatory Visit: Payer: Commercial Managed Care - PPO | Admitting: Legal Medicine

## 2020-11-01 ENCOUNTER — Encounter: Payer: Self-pay | Admitting: Legal Medicine

## 2020-11-01 ENCOUNTER — Other Ambulatory Visit: Payer: Self-pay

## 2020-11-01 VITALS — BP 138/82 | HR 86 | Temp 97.6°F | Ht 71.0 in | Wt 245.0 lb

## 2020-11-01 DIAGNOSIS — E782 Mixed hyperlipidemia: Secondary | ICD-10-CM | POA: Diagnosis not present

## 2020-11-01 DIAGNOSIS — R55 Syncope and collapse: Secondary | ICD-10-CM | POA: Insufficient documentation

## 2020-11-01 DIAGNOSIS — B029 Zoster without complications: Secondary | ICD-10-CM

## 2020-11-01 HISTORY — DX: Syncope and collapse: R55

## 2020-11-01 HISTORY — DX: Zoster without complications: B02.9

## 2020-11-01 MED ORDER — PREDNISONE 10 MG (21) PO TBPK: ORAL_TABLET | ORAL | 0 refills | Status: DC

## 2020-11-01 MED ORDER — VALACYCLOVIR HCL 1 G PO TABS: 1000.0000 mg | ORAL_TABLET | Freq: Two times a day (BID) | ORAL | 0 refills | Status: DC

## 2020-11-01 NOTE — Progress Notes (Signed)
Subjective:  Patient ID: Timothy Small, male    DOB: [birth year]  Age: 50 y.o. MRN: [id number]  Chief Complaint  Patient presents with  . Dizziness    Passing out from getting so dizzy.    HPI: syncope after standing quickly , started last week, and happening again this week.No incontinence and no seizure activity. No tachycardia, drinking fluids.started one year ago.   Current Outpatient Medications on File Prior to Visit  Medication Sig Dispense Refill  . amLODipine (NORVASC) 10 MG tablet Take 1 tablet (10 mg total) by mouth every morning. 90 tablet 2  . atorvastatin (LIPITOR) 40 MG tablet Take 1 tablet (40 mg total) by mouth daily. 90 tablet 2  . hydrochlorothiazide (HYDRODIURIL) 25 MG tablet TAKE 1 TABLET BY MOUTH EVERY DAY 90 tablet 2  . sildenafil (REVATIO) 20 MG tablet TAKE 1 TABLET (20 MG TOTAL) BY MOUTH 3 (THREE) TIMES DAILY. 30 tablet 10  . valsartan (DIOVAN) 320 MG tablet Take 1 tablet (320 mg total) by mouth daily. 90 tablet 2   No current facility-administered medications on file prior to visit.   Past Medical History:  Diagnosis Date  . COPD (chronic obstructive pulmonary disease) ([place])   . ED (erectile dysfunction)   . Hypertension   . OSA (obstructive sleep apnea)    s/p  osa surgery 2011-- sleep study repeated after surgery per pt stated mild osa no cpap  . Renal cyst 2010   multiple  . Right ureteral stone    Past Surgical History:  Procedure Laterality Date  . CYSTOSCOPY/RETROGRADE/URETEROSCOPY/STONE EXTRACTION WITH BASKET Right 12/23/2015   Procedure: CYSTOSCOPY/RIGHT RETROGRADE/ PYELOGRAM URETEROSCOPY/STONE EXTRACTION WITH BASKET;  Surgeon: Kathie Rhodes, MD;  Location: [facility];  Service: Urology;  Laterality: Right;  . UVULOPALATOPHARYNGOPLASTY  2011   and Tonsillectomy/ Adenoidectomy for sleep apnea    Family History  Problem Relation Age of Onset  . Cancer Father        bladder Ca   . Heart disease Maternal Aunt 45       heart  failure  . Heart disease Maternal Uncle   . Heart disease Maternal Grandmother   . Heart disease Maternal Grandfather   . Hypertension Brother    Social History   Socioeconomic History  . Marital status: Married    Spouse name: [name]  . Number of children: 2  . Years of education: Not on file  . Highest education level: Not on file  Occupational History    Employer: [employer]  Tobacco Use  . Smoking status: Never Smoker  . Smokeless tobacco: Current User    Types: Snuff  . Tobacco comment: 1 can every two days  Substance and Sexual Activity  . Alcohol use: Yes    Alcohol/week: 4.0 standard drinks    Types: 4 Cans of beer per week    Comment: socially  . Drug use: No  . Sexual activity: Yes  Other Topics Concern  . Not on file  Social History Narrative  . Not on file   Social Determinants of Health   Financial Resource Strain: Not on file  Food Insecurity: Not on file  Transportation Needs: Not on file  Physical Activity: Not on file  Stress: Not on file  Social Connections: Not on file    Review of Systems  Constitutional: Negative for activity change and appetite change.  HENT: Negative for congestion and sinus pain.   Eyes: Negative for visual disturbance.  Respiratory: Positive for shortness of breath.  Negative for chest tightness.   Cardiovascular: Negative for chest pain.  Gastrointestinal: Negative for abdominal distention and abdominal pain.  Endocrine: Positive for polyuria.  Genitourinary: Negative for difficulty urinating, dysuria and urgency.  Musculoskeletal: Negative for arthralgias and back pain.  Neurological: Positive for syncope and light-headedness.  Psychiatric/Behavioral: Negative.      Objective:  BP 138/82 (BP Location: Left Arm, Patient Position: Sitting)   Pulse 86   Temp 97.6 F (36.4 C) (Temporal)   Ht 5\' 11"  (1.803 m)   Wt 245 lb (111.1 kg)   SpO2 99%   BMI 34.17 kg/m   BP/Weight 11/01/2020 02/13/4192 02/19/239  Systolic BP 973  532 992  Diastolic BP 82 86 86  Wt. (Lbs) 245 251 256.4  BMI 34.17 35.01 34.77    Physical Exam Vitals reviewed.  Constitutional:      Appearance: Normal appearance.  HENT:     Right Ear: Tympanic membrane normal.     Left Ear: Tympanic membrane normal.     Mouth/Throat:     Mouth: Mucous membranes are moist.  Eyes:     Extraocular Movements: Extraocular movements intact.     Conjunctiva/sclera: Conjunctivae normal.     Pupils: Pupils are equal, round, and reactive to light.  Cardiovascular:     Rate and Rhythm: Normal rate.     Pulses: Normal pulses.     Heart sounds: Normal heart sounds. No murmur heard. No gallop.   Pulmonary:     Effort: Pulmonary effort is normal. No respiratory distress.     Breath sounds: Normal breath sounds. No rales.  Abdominal:     General: Abdomen is flat. Bowel sounds are normal.     Palpations: Abdomen is soft.  Musculoskeletal:        General: Normal range of motion.     Cervical back: Normal range of motion and neck supple.  Skin:    Findings: Rash present.     Comments: Vesicular rash healing right about t7  Neurological:     General: No focal deficit present.     Mental Status: He is alert and oriented to person, place, and time. Mental status is at baseline.  Psychiatric:        Mood and Affect: Mood normal.    EKG, first degree block, poor R-wave progression precordially, no ST-T changes   Lab Results  Component Value Date   WBC 4.8 09/19/2020   HGB 17.1 09/19/2020   HCT 49.6 09/19/2020   PLT 216 09/19/2020   GLUCOSE 99 09/19/2020   CHOL 132 09/19/2020   TRIG 133 09/19/2020   HDL 36 (L) 09/19/2020   LDLDIRECT 77.0 09/12/2016   LDLCALC 72 09/19/2020   ALT 27 09/19/2020   AST 16 09/19/2020   NA 142 09/19/2020   K 3.9 09/19/2020   CL 102 09/19/2020   CREATININE 1.19 09/19/2020   BUN 19 09/19/2020   CO2 23 09/19/2020   TSH 1.47 09/12/2016   HGBA1C 5.3 09/12/2016      Assessment & Plan:   Diagnoses and all  orders for this visit: Vasovagal syncope -     EKG 12-Lead -     Ambulatory referral to Cardiology -     Comprehensive metabolic panel Patient had syncopal episode with no seizure -like activities, family history for early CVD,, needs full cardiac workup/ Herpes zoster without complication -     valACYclovir (VALTREX) 1000 MG tablet; Take 1 tablet (1,000 mg total) by mouth 2 (two) times daily. -  predniSONE (STERAPRED UNI-PAK 21 TAB) 10 MG (21) TBPK tablet; Take 6ills first day , then 5 pills day 2 and then cut down one pill day until gone We discussed shingles treatment and protection of children Mixed hyperlipidemia AN INDIVIDUAL CARE PLAN for hyperlipidemia/ cholesterol was established and reinforced today.  The patient's status was assessed using clinical findings on exam, lab and other diagnostic tests. The patient's disease status was assessed based on evidence-based guidelines and found to be fair controlled. MEDICATIONS were reviewed. SELF MANAGEMENT GOALS have been discussed and patient's success at attaining the goal of low cholesterol was assessed. RECOMMENDATION given include regular exercise 3 days a week and low cholesterol/low fat diet. CLINICAL SUMMARY including written plan to identify barriers unique to the patient due to social or economic  reasons was discussed.         I spent 30 minutes dedicated to the care of this patient on the date of this encounter to include face-to-face time with the patient, as well as: reviewed records  Follow-up: Return in about 2 months (around 01/01/2021) for fasting.  An After Visit Summary was printed and given to the patient.  Reinaldo Meeker, MD [facility] [phone]

## 2020-11-02 ENCOUNTER — Other Ambulatory Visit: Payer: Self-pay | Admitting: Legal Medicine

## 2020-11-02 DIAGNOSIS — E876 Hypokalemia: Secondary | ICD-10-CM

## 2020-11-02 LAB — COMPREHENSIVE METABOLIC PANEL
ALT: 26 IU/L (ref 0–44)
AST: 18 IU/L (ref 0–40)
Albumin/Globulin Ratio: 2.2 (ref 1.2–2.2)
Albumin: 4.8 g/dL (ref 4.0–5.0)
Alkaline Phosphatase: 78 IU/L (ref 44–121)
BUN/Creatinine Ratio: 15 (ref 9–20)
BUN: 14 mg/dL (ref 6–24)
Bilirubin Total: 0.3 mg/dL (ref 0.0–1.2)
CO2: 23 mmol/L (ref 20–29)
Calcium: 9.3 mg/dL (ref 8.7–10.2)
Chloride: 98 mmol/L (ref 96–106)
Creatinine, Ser: 0.93 mg/dL (ref 0.76–1.27)
Globulin, Total: 2.2 g/dL (ref 1.5–4.5)
Glucose: 114 mg/dL — ABNORMAL HIGH (ref 65–99)
Potassium: 3.3 mmol/L — ABNORMAL LOW (ref 3.5–5.2)
Sodium: 138 mmol/L (ref 134–144)
Total Protein: 7 g/dL (ref 6.0–8.5)
eGFR: 101 mL/min/{1.73_m2} (ref >59)

## 2020-11-02 MED ORDER — POTASSIUM CHLORIDE CRYS ER 20 MEQ PO TBCR: 20.0000 meq | EXTENDED_RELEASE_TABLET | Freq: Every day | ORAL | 3 refills | Status: DC

## 2020-11-02 NOTE — Progress Notes (Signed)
Glucose 114, kidneys normal, potassium 3.3 start potassium one a day, recheck potassium one week lp

## 2020-11-03 ENCOUNTER — Other Ambulatory Visit: Payer: Self-pay

## 2020-11-03 DIAGNOSIS — E876 Hypokalemia: Secondary | ICD-10-CM

## 2020-11-03 NOTE — Progress Notes (Signed)
c 

## 2020-11-10 ENCOUNTER — Other Ambulatory Visit: Payer: Commercial Managed Care - PPO

## 2020-11-10 DIAGNOSIS — E876 Hypokalemia: Secondary | ICD-10-CM

## 2020-11-11 LAB — COMPREHENSIVE METABOLIC PANEL
ALT: 34 IU/L (ref 0–44)
AST: 13 IU/L (ref 0–40)
Albumin/Globulin Ratio: 2 (ref 1.2–2.2)
Albumin: 4.6 g/dL (ref 4.0–5.0)
Alkaline Phosphatase: 74 IU/L (ref 44–121)
BUN/Creatinine Ratio: 14 (ref 9–20)
BUN: 14 mg/dL (ref 6–24)
Bilirubin Total: 0.4 mg/dL (ref 0.0–1.2)
CO2: 23 mmol/L (ref 20–29)
Calcium: 9.4 mg/dL (ref 8.7–10.2)
Chloride: 102 mmol/L (ref 96–106)
Creatinine, Ser: 1.02 mg/dL (ref 0.76–1.27)
Globulin, Total: 2.3 g/dL (ref 1.5–4.5)
Glucose: 100 mg/dL — ABNORMAL HIGH (ref 65–99)
Potassium: 4.2 mmol/L (ref 3.5–5.2)
Sodium: 142 mmol/L (ref 134–144)
Total Protein: 6.9 g/dL (ref 6.0–8.5)
eGFR: 90 mL/min/{1.73_m2} (ref >59)

## 2020-11-11 NOTE — Progress Notes (Signed)
Glucose 100,  kidney and liver tests normal, potassium 4.2 normal now lp

## 2020-11-15 DIAGNOSIS — G4733 Obstructive sleep apnea (adult) (pediatric): Secondary | ICD-10-CM | POA: Insufficient documentation

## 2020-11-15 DIAGNOSIS — N529 Male erectile dysfunction, unspecified: Secondary | ICD-10-CM | POA: Insufficient documentation

## 2020-11-15 DIAGNOSIS — I10 Essential (primary) hypertension: Secondary | ICD-10-CM | POA: Insufficient documentation

## 2020-11-15 DIAGNOSIS — J449 Chronic obstructive pulmonary disease, unspecified: Secondary | ICD-10-CM | POA: Insufficient documentation

## 2020-11-15 DIAGNOSIS — N201 Calculus of ureter: Secondary | ICD-10-CM | POA: Insufficient documentation

## 2020-11-17 ENCOUNTER — Ambulatory Visit: Payer: Commercial Managed Care - PPO | Admitting: Cardiology

## 2020-11-17 ENCOUNTER — Ambulatory Visit (INDEPENDENT_AMBULATORY_CARE_PROVIDER_SITE_OTHER): Payer: Commercial Managed Care - PPO

## 2020-11-17 ENCOUNTER — Other Ambulatory Visit: Payer: Self-pay

## 2020-11-17 ENCOUNTER — Encounter: Payer: Self-pay | Admitting: Cardiology

## 2020-11-17 VITALS — BP 164/108 | HR 62 | Ht 71.0 in | Wt 248.0 lb

## 2020-11-17 DIAGNOSIS — I10 Essential (primary) hypertension: Secondary | ICD-10-CM

## 2020-11-17 DIAGNOSIS — R42 Dizziness and giddiness: Secondary | ICD-10-CM | POA: Insufficient documentation

## 2020-11-17 DIAGNOSIS — Z6834 Body mass index (BMI) 34.0-34.9, adult: Secondary | ICD-10-CM

## 2020-11-17 DIAGNOSIS — R0602 Shortness of breath: Secondary | ICD-10-CM

## 2020-11-17 DIAGNOSIS — R079 Chest pain, unspecified: Secondary | ICD-10-CM

## 2020-11-17 DIAGNOSIS — E669 Obesity, unspecified: Secondary | ICD-10-CM

## 2020-11-17 DIAGNOSIS — R002 Palpitations: Secondary | ICD-10-CM

## 2020-11-17 DIAGNOSIS — E8881 Metabolic syndrome: Secondary | ICD-10-CM

## 2020-11-17 DIAGNOSIS — R55 Syncope and collapse: Secondary | ICD-10-CM | POA: Insufficient documentation

## 2020-11-17 HISTORY — DX: Chest pain, unspecified: R07.9

## 2020-11-17 HISTORY — DX: Palpitations: R00.2

## 2020-11-17 HISTORY — DX: Shortness of breath: R06.02

## 2020-11-17 HISTORY — DX: Body mass index (BMI) 34.0-34.9, adult: Z68.34

## 2020-11-17 HISTORY — DX: Dizziness and giddiness: R42

## 2020-11-17 HISTORY — DX: Syncope and collapse: R55

## 2020-11-17 MED ORDER — CARVEDILOL 6.25 MG PO TABS: 6.2500 mg | ORAL_TABLET | Freq: Two times a day (BID) | ORAL | 3 refills | Status: DC

## 2020-11-17 NOTE — Progress Notes (Signed)
Cardiology Office Note:    Date:  11/17/2020   ID:  Timothy Small, DOB [birth year], MRN [id number]  PCP:  Lillard Anes, MD  Cardiologist:  Berniece Salines, DO  Electrophysiologist:  None   Referring MD: Lillard Anes,*   I have had some shortness of breath and palpitation with dizzy spells.  Recently passed out  History of Present Illness:    Timothy Small is a 50 y.o. male with a hx of hypertension, obstructive sleep apnea status post surgery, obesity is here today at the request of his primary care provider to be evaluated for multiple reasons.  History starts with the fact that he recently passed out.  He tells me that he was in some work he started to squat and when he squatted after standing up he instantly passed out.  He does not remember how long he was down for he only will remember waking up on the ground.  He did not have any bowel or bladder incontinence.    Patient tells me he has had intermittent chest pain he described as a midsternal sensation sometimes as a pressure-like sensation sometimes is burning.  Is about 5/10 in severity he notes and reports that usually last for 5/10 minutes.  This can be associated with shortness of breath at times but most times it is not.  In terms of shortness of breath he notes that on minimal exertion he gets significantly short of breath and he is concerned. His recent problem is the fact that he has been feeling dizziness with palpitations.  He has abrupt onset of fast heartbeats which goes for a few minutes and wants to start he gets significantly dizzy after this once the palpitation resolved his dizziness resolved.  He has not had any second episode of passing out.  Past Medical History:  Diagnosis Date  . COPD (chronic obstructive pulmonary disease) ([place])   . ED (erectile dysfunction)   . Hypertension   . OSA (obstructive sleep apnea)    s/p  osa surgery 2011-- sleep study repeated after surgery per pt stated mild  osa no cpap  . Renal cyst 2010   multiple  . Right ureteral stone     Past Surgical History:  Procedure Laterality Date  . CYSTOSCOPY/RETROGRADE/URETEROSCOPY/STONE EXTRACTION WITH BASKET Right 12/23/2015   Procedure: CYSTOSCOPY/RIGHT RETROGRADE/ PYELOGRAM URETEROSCOPY/STONE EXTRACTION WITH BASKET;  Surgeon: Kathie Rhodes, MD;  Location: [facility];  Service: Urology;  Laterality: Right;  . UVULOPALATOPHARYNGOPLASTY  2011   and Tonsillectomy/ Adenoidectomy for sleep apnea    Current Medications: Current Meds  Medication Sig  . atorvastatin (LIPITOR) 40 MG tablet Take 1 tablet (40 mg total) by mouth daily.  . carvedilol (COREG) 6.25 MG tablet Take 1 tablet (6.25 mg total) by mouth 2 (two) times daily.  . hydrochlorothiazide (HYDRODIURIL) 25 MG tablet TAKE 1 TABLET BY MOUTH EVERY DAY  . potassium chloride (KLOR-CON) 20 MEQ packet Take 20 mEq by mouth once.  . valsartan (DIOVAN) 320 MG tablet Take 1 tablet (320 mg total) by mouth daily.  . [DISCONTINUED] potassium chloride SA (KLOR-CON) 20 MEQ tablet Take 1 tablet (20 mEq total) by mouth daily.     Allergies:   Patient has no known allergies.   Social History   Socioeconomic History  . Marital status: Married    Spouse name: Timothy Small  . Number of children: 2  . Years of education: Not on file  . Highest education level: Not on file  Occupational History  Employer: [employer]  Tobacco Use  . Smoking status: Never Smoker  . Smokeless tobacco: Current User    Types: Snuff  . Tobacco comment: 1 can every two days  Substance and Sexual Activity  . Alcohol use: Yes    Alcohol/week: 4.0 standard drinks    Types: 4 Cans of beer per week    Comment: socially  . Drug use: No  . Sexual activity: Yes  Other Topics Concern  . Not on file  Social History Narrative  . Not on file   Social Determinants of Health   Financial Resource Strain: Not on file  Food Insecurity: Not on file  Transportation Needs: Not on file   Physical Activity: Not on file  Stress: Not on file  Social Connections: Not on file     Family History: The patient's family history includes Cancer in his father; Heart disease in his maternal grandfather, maternal grandmother, and maternal uncle; Heart disease (age of onset: 74) in his maternal aunt; Hypertension in his brother.  ROS:   Review of Systems  Constitution: Negative for decreased appetite, fever and weight gain.  HENT: Negative for congestion, ear discharge, hoarse voice and sore throat.   Eyes: Negative for discharge, redness, vision loss in right eye and visual halos.  Cardiovascular: Reports chest pain, dyspnea on exertion and palpitations.  Negative for orthopnea Respiratory: Negative for cough, hemoptysis, shortness of breath and snoring.   Endocrine: Negative for heat intolerance and polyphagia.  Hematologic/Lymphatic: Negative for bleeding problem. Does not bruise/bleed easily.  Skin: Negative for flushing, nail changes, rash and suspicious lesions.  Musculoskeletal: Negative for arthritis, joint pain, muscle cramps, myalgias, neck pain and stiffness.  Gastrointestinal: Negative for abdominal pain, bowel incontinence, diarrhea and excessive appetite.  Genitourinary: Negative for decreased libido, genital sores and incomplete emptying.  Neurological: Negative for brief paralysis, focal weakness, headaches and loss of balance.  Psychiatric/Behavioral: Negative for altered mental status, depression and suicidal ideas.  Allergic/Immunologic: Negative for HIV exposure and persistent infections.    EKGs/Labs/Other Studies Reviewed:    The following studies were reviewed today:   EKG:  The ekg ordered today demonstrates sinus rhythm, heart rate 62 bpm with arrhythmia and first-degree AV block.  Possible left atrial enlargement.  Recent Labs: 09/19/2020: Hemoglobin 17.1; Platelets 216 11/10/2020: ALT 34; BUN 14; Creatinine, Ser 1.02; Potassium 4.2; Sodium 142  Recent  Lipid Panel    Component Value Date/Time   CHOL 132 09/19/2020 0801   TRIG 133 09/19/2020 0801   HDL 36 (L) 09/19/2020 0801   CHOLHDL 3.7 09/19/2020 0801   CHOLHDL 3 09/12/2016 1529   VLDL 46.2 (H) 09/12/2016 1529   LDLCALC 72 09/19/2020 0801   LDLDIRECT 77.0 09/12/2016 1529    Physical Exam:    VS:  BP (!) 164/108 (BP Location: Right Arm)   Pulse 62   Ht 5\' 11"  (1.803 m)   Wt 248 lb (112.5 kg)   SpO2 98%   BMI 34.59 kg/m     Wt Readings from Last 3 Encounters:  11/17/20 248 lb (112.5 kg)  11/01/20 245 lb (111.1 kg)  09/19/20 251 lb (113.9 kg)     GEN: Well nourished, well developed in no acute distress HEENT: Normal NECK: No JVD; No carotid bruits LYMPHATICS: No lymphadenopathy CARDIAC: S1S2 noted,RRR, no murmurs, rubs, gallops RESPIRATORY:  Clear to auscultation without rales, wheezing or rhonchi  ABDOMEN: Soft, non-tender, non-distended, +bowel sounds, no guarding. EXTREMITIES: No edema, No cyanosis, no clubbing MUSCULOSKELETAL:  No deformity  SKIN:  Warm and dry NEUROLOGIC:  Alert and oriented x 3, non-focal PSYCHIATRIC:  Normal affect, good insight  ASSESSMENT:    1. Syncope and collapse   2. Chest pain, unspecified type   3. Shortness of breath   4. Dizziness   5. Hypertension, unspecified type   6. Metabolic syndrome   7. Palpitations   8. Obesity (BMI 30-39.9)    PLAN:     His chest pain shortness of breath is concerning patient does have intermediate risk for coronary artery disease at this time I would like to pursue an ischemic evaluation in this patient.  Shared decision a coronary CTA at this time is appropriate.  I have discussed with the patient about the testing.  The patient has no IV contrast allergy and is agreeable to proceed with this test.  In terms of his worsening shortness of breath and recent syncope episode echocardiogram is appropriate this time discussed with any structural abnormalities.  His LV and RV function will also be  assessed.  I would like to rule out a cardiovascular etiology of this palpitation, therefore at this time I would like to placed a zio patch for 14 days.   He is hypertensive in the office today his amlodipine was recently stopped after he had his syncope episode.  I like to start carvedilol 6.25 mg twice daily.  He will continue on his hydrochlorothiazide 25 mg daily along with his valsartan 320 mg daily.  There is history of metabolic syndrome we will get hemoglobin A1c as well to make sure diabetes is not playing a role.  Continue his Lipitor 40 mg daily his most recent lipid profile in February 2022 showed HDL 36, LDL 72, total cholesterol 132 and triglyceride 133.  The patient understands the need to lose weight with diet and exercise. We have discussed specific strategies for this.   He will check his blood pressure daily and in the next 2 weeks he will forward this information to make sure we need to increase his medication.  The patient is in agreement with the above plan. The patient left the office in stable condition.  The patient will follow up in 6 weeks or sooner if needed.   Medication Adjustments/Labs and Tests Ordered: Current medicines are reviewed at length with the patient today.  Concerns regarding medicines are outlined above.  Orders Placed This Encounter  Procedures  . CT CORONARY MORPH W/CTA COR W/SCORE W/CA W/CM &/OR WO/CM  . CT CORONARY FRACTIONAL FLOW RESERVE DATA PREP  . CT CORONARY FRACTIONAL FLOW RESERVE FLUID ANALYSIS  . Hemoglobin A1c  . LONG TERM MONITOR (3-14 DAYS)  . EKG 12-Lead  . ECHOCARDIOGRAM COMPLETE   Meds ordered this encounter  Medications  . carvedilol (COREG) 6.25 MG tablet    Sig: Take 1 tablet (6.25 mg total) by mouth 2 (two) times daily.    Dispense:  180 tablet    Refill:  3    Patient Instructions   Medication Instructions:  Your physician has recommended you make the following change in your medication:  START: Coreg 6.25  mg twice a day  *If you need a refill on your cardiac medications before your next appointment, please call your pharmacy*   Lab Work: Your physician recommends that you return for lab work: TODAY: HbA1C If you have labs (blood work) drawn today and your tests are completely normal, you will receive your results only by: Marland Kitchen MyChart Message (if you have MyChart) OR . A paper copy in  the mail If you have any lab test that is abnormal or we need to change your treatment, we will call you to review the results.   Testing/Procedures: A zio monitor was ordered today. It will remain on for 14 days. You will then return monitor and event diary in provided box. It takes 1-2 weeks for report to be downloaded and returned to [place]. We will call you with the results. If monitor falls off or has orange flashing light, please call Zio for further instructions.    Your physician has requested that you have an echocardiogram. Echocardiography is a painless test that uses sound waves to create images of your heart. It provides your doctor with information about the size and shape of your heart and how well your heart's chambers and valves are working. This procedure takes approximately one hour. There are no restrictions for this procedure.  Your cardiac CT will be scheduled at :  [facility] [address] [place], [place] [address] [phone]  If scheduled at [facility], please arrive at the [facility] main entrance (entrance A) of [facility] 30 minutes prior to test start time. Proceed to the [facility] Radiology Department (first floor) to check-in and test prep.  Please follow these instructions carefully (unless otherwise directed):  Hold all erectile dysfunction medications at least 3 days (72 hrs) prior to test.  On the Night Before the Test: . Be sure to Drink plenty of water. . Do not consume any caffeinated/decaffeinated beverages or chocolate 12 hours  prior to your test. . Do not take any antihistamines 12 hours prior to your test.  On the Day of the Test: . Drink plenty of water until 1 hour prior to the test. . Do not eat any food 4 hours prior to the test. . You may take your regular medications prior to the test.  . Take metoprolol (Lopressor) two hours prior to test. . HOLD Hydrochlorothiazide (Hydrodiuril) morning of the test. . FEMALES- please wear underwire-free bra if available       After the Test: . Drink plenty of water. . After receiving IV contrast, you may experience a mild flushed feeling. This is normal. . On occasion, you may experience a mild rash up to 24 hours after the test. This is not dangerous. If this occurs, you can take Benadryl 25 mg and increase your fluid intake. . If you experience trouble breathing, this can be serious. If it is severe call 911 IMMEDIATELY. If it is mild, please call our office. . If you take any of these medications: Glipizide/Metformin, Avandament, Glucavance, please do not take 48 hours after completing test unless otherwise instructed.   Once we have confirmed authorization from your insurance company, we will call you to set up a date and time for your test. Based on how quickly your insurance processes prior authorizations requests, please allow up to 4 weeks to be contacted for scheduling your Cardiac CT appointment. Be advised that routine Cardiac CT appointments could be scheduled as many as 8 weeks after your provider has ordered it.  For non-scheduling related questions, please contact the cardiac imaging nurse navigator should you have any questions/concerns: Marchia Bond, Cardiac Imaging Nurse Navigator Gordy Clement, Cardiac Imaging Nurse Navigator [facility] Direct Office Dial: [phone]   For scheduling needs, including cancellations and rescheduling, please call [place], [phone].   Follow-Up: At [facility], you and your  health needs are our priority.  As part of our continuing mission to provide you with exceptional heart care, we have created designated Provider Care Teams.  These Care Teams include your primary Cardiologist (physician) and Advanced Practice Providers (APPs -  Physician Assistants and Nurse Practitioners) who all work together to provide you with the care you need, when you need it.  We recommend signing up for the patient portal called "MyChart".  Sign up information is provided on this After Visit Summary.  MyChart is used to connect with patients for Virtual Visits (Telemedicine).  Patients are able to view lab/test results, encounter notes, upcoming appointments, etc.  Non-urgent messages can be sent to your provider as well.   To learn more about what you can do with MyChart, go to [email or web address].    Your next appointment:   6 week(s)  The format for your next appointment:   In Person  Provider:   Berniece Salines, DO   Other Instructions  Echocardiogram An echocardiogram is a test that uses sound waves (ultrasound) to produce images of the heart. Images from an echocardiogram can provide important information about:  Heart size and shape.  The size and thickness and movement of your heart's walls.  Heart muscle function and strength.  Heart valve function or if you have stenosis. Stenosis is when the heart valves are too narrow.  If blood is flowing backward through the heart valves (regurgitation).  A tumor or infectious growth around the heart valves.  Areas of heart muscle that are not working well because of poor blood flow or injury from a heart attack.  Aneurysm detection. An aneurysm is a weak or damaged part of an artery wall. The wall bulges out from the normal force of blood pumping through the body. Tell a health care provider about:  Any allergies you have.  All medicines you are taking, including vitamins, herbs, eye drops, creams, and over-the-counter  medicines.  Any blood disorders you have.  Any surgeries you have had.  Any medical conditions you have.  Whether you are pregnant or may be pregnant. What are the risks? Generally, this is a safe test. However, problems may occur, including an allergic reaction to dye (contrast) that may be used during the test. What happens before the test? No specific preparation is needed. You may eat and drink normally. What happens during the test?  You will take off your clothes from the waist up and put on a hospital gown.  Electrodes or electrocardiogram (ECG)patches may be placed on your chest. The electrodes or patches are then connected to a device that monitors your heart rate and rhythm.  You will lie down on a table for an ultrasound exam. A gel will be applied to your chest to help sound waves pass through your skin.  A handheld device, called a transducer, will be pressed against your chest and moved over your heart. The transducer produces sound waves that travel to your heart and bounce back (or "echo" back) to the transducer. These sound waves will be captured in real-time and changed into images of your heart that can be viewed on a video monitor. The images will be recorded on a computer and reviewed by your health care provider.  You may be asked to change positions or hold your breath for a short time. This makes it easier to get different views or better views of your heart.  In some cases, you may receive contrast through an IV in one of your veins. This can improve  the quality of the pictures from your heart. The procedure may vary among health care providers and hospitals.   What can I expect after the test? You may return to your normal, everyday life, including diet, activities, and medicines, unless your health care provider tells you not to do that. Follow these instructions at home:  It is up to you to get the results of your test. Ask your health care provider, or the  department that is doing the test, when your results will be ready.  Keep all follow-up visits. This is important. Summary  An echocardiogram is a test that uses sound waves (ultrasound) to produce images of the heart.  Images from an echocardiogram can provide important information about the size and shape of your heart, heart muscle function, heart valve function, and other possible heart problems.  You do not need to do anything to prepare before this test. You may eat and drink normally.  After the echocardiogram is completed, you may return to your normal, everyday life, unless your health care provider tells you not to do that. This information is not intended to replace advice given to you by your health care provider. Make sure you discuss any questions you have with your health care provider. Document Revised: 03/22/2020 Document Reviewed: 03/22/2020 Elsevier Patient Education  2021 [place].      Adopting a Healthy Lifestyle.  Know what a healthy weight is for you (roughly BMI <25) and aim to maintain this   Aim for 7+ servings of fruits and vegetables daily   65-80+ fluid ounces of water or unsweet tea for healthy kidneys   Limit to max 1 drink of alcohol per day; avoid smoking/tobacco   Limit animal fats in diet for cholesterol and heart health - choose grass fed whenever available   Avoid highly processed foods, and foods high in saturated/trans fats   Aim for low stress - take time to unwind and care for your mental health   Aim for 150 min of moderate intensity exercise weekly for heart health, and weights twice weekly for bone health   Aim for 7-9 hours of sleep daily   When it comes to diets, agreement about the perfect plan isnt easy to find, even among the experts. Experts at the [employer] developed an idea known as the Healthy Eating Plate. Just imagine a plate divided into logical, healthy portions.   The emphasis is on diet  quality:   Load up on vegetables and fruits - one-half of your plate: Aim for color and variety, and remember that potatoes dont count.   Go for whole grains - one-quarter of your plate: Whole wheat, barley, wheat berries, quinoa, oats, brown rice, and foods made with them. If you want pasta, go with whole wheat pasta.   Protein power - one-quarter of your plate: Fish, chicken, beans, and nuts are all healthy, versatile protein sources. Limit red meat.   The diet, however, does go beyond the plate, offering a few other suggestions.   Use healthy plant oils, such as olive, canola, soy, corn, sunflower and peanut. Check the labels, and avoid partially hydrogenated oil, which have unhealthy trans fats.   If youre thirsty, drink water. Coffee and tea are good in moderation, but skip sugary drinks and limit milk and dairy products to one or two daily servings.   The type of carbohydrate in the diet is more important than the amount. Some sources of carbohydrates, such as vegetables,  fruits, whole grains, and beans-are healthier than others.   Finally, stay active  Signed, Berniece Salines, DO  11/17/2020 9:46 AM    [place]

## 2020-11-17 NOTE — Patient Instructions (Addendum)
Medication Instructions:  Your physician has recommended you make the following change in your medication:  START: Coreg 6.25 mg twice a day  *If you need a refill on your cardiac medications before your next appointment, please call your pharmacy*   Lab Work: Your physician recommends that you return for lab work: TODAY: HbA1C 3-7 days before CT:  BMET, Mag If you have labs (blood work) drawn today and your tests are completely normal, you will receive your results only by: Marland Kitchen MyChart Message (if you have MyChart) OR . A paper copy in the mail If you have any lab test that is abnormal or we need to change your treatment, we will call you to review the results.   Testing/Procedures: A zio monitor was ordered today. It will remain on for 14 days. You will then return monitor and event diary in provided box. It takes 1-2 weeks for report to be downloaded and returned to [place]. We will call you with the results. If monitor falls off or has orange flashing light, please call Zio for further instructions.    Your physician has requested that you have an echocardiogram. Echocardiography is a painless test that uses sound waves to create images of your heart. It provides your doctor with information about the size and shape of your heart and how well your heart's chambers and valves are working. This procedure takes approximately one hour. There are no restrictions for this procedure.  Your cardiac CT will be scheduled at :  [facility] [address] [place], [place] [address] [phone]  If scheduled at [facility], please arrive at the [facility] main entrance (entrance A) of [facility] 30 minutes prior to test start time. Proceed to the [facility] Radiology Department (first floor) to check-in and test prep.  Please follow these instructions carefully (unless otherwise directed):  Hold all erectile dysfunction medications at least 3 days (72 hrs) prior to  test.  On the Night Before the Test: . Be sure to Drink plenty of water. . Do not consume any caffeinated/decaffeinated beverages or chocolate 12 hours prior to your test. . Do not take any antihistamines 12 hours prior to your test.  On the Day of the Test: . Drink plenty of water until 1 hour prior to the test. . Do not eat any food 4 hours prior to the test. . You may take your regular medications prior to the test.  . Take metoprolol (Lopressor) two hours prior to test. . HOLD Hydrochlorothiazide (Hydrodiuril) morning of the test.       After the Test: . Drink plenty of water. . After receiving IV contrast, you may experience a mild flushed feeling. This is normal. . On occasion, you may experience a mild rash up to 24 hours after the test. This is not dangerous. If this occurs, you can take Benadryl 25 mg and increase your fluid intake. . If you experience trouble breathing, this can be serious. If it is severe call 911 IMMEDIATELY. If it is mild, please call our office. . If you take any of these medications: Glipizide/Metformin, Avandament, Glucavance, please do not take 48 hours after completing test unless otherwise instructed.   Once we have confirmed authorization from your insurance company, we will call you to set up a date and time for your test. Based on how quickly your insurance processes prior authorizations requests, please allow up to 4 weeks to be contacted for scheduling your Cardiac CT  appointment. Be advised that routine Cardiac CT appointments could be scheduled as many as 8 weeks after your provider has ordered it.  For non-scheduling related questions, please contact the cardiac imaging nurse navigator should you have any questions/concerns: Marchia Bond, Cardiac Imaging Nurse Navigator Gordy Clement, Cardiac Imaging Nurse Navigator [facility] Direct Office Dial: [phone]   For scheduling needs, including cancellations and  rescheduling, please call [place], [phone].   Follow-Up: At [facility], you and your health needs are our priority.  As part of our continuing mission to provide you with exceptional heart care, we have created designated Provider Care Teams.  These Care Teams include your primary Cardiologist (physician) and Advanced Practice Providers (APPs -  Physician Assistants and Nurse Practitioners) who all work together to provide you with the care you need, when you need it.  We recommend signing up for the patient portal called "MyChart".  Sign up information is provided on this After Visit Summary.  MyChart is used to connect with patients for Virtual Visits (Telemedicine).  Patients are able to view lab/test results, encounter notes, upcoming appointments, etc.  Non-urgent messages can be sent to your provider as well.   To learn more about what you can do with MyChart, go to [email or web address].    Your next appointment:   6 week(s)  The format for your next appointment:   In Person  Provider:   Berniece Salines, DO   Other Instructions  Echocardiogram An echocardiogram is a test that uses sound waves (ultrasound) to produce images of the heart. Images from an echocardiogram can provide important information about:  Heart size and shape.  The size and thickness and movement of your heart's walls.  Heart muscle function and strength.  Heart valve function or if you have stenosis. Stenosis is when the heart valves are too narrow.  If blood is flowing backward through the heart valves (regurgitation).  A tumor or infectious growth around the heart valves.  Areas of heart muscle that are not working well because of poor blood flow or injury from a heart attack.  Aneurysm detection. An aneurysm is a weak or damaged part of an artery wall. The wall bulges out from the normal force of blood pumping through the body. Tell a health care provider about:  Any allergies you  have.  All medicines you are taking, including vitamins, herbs, eye drops, creams, and over-the-counter medicines.  Any blood disorders you have.  Any surgeries you have had.  Any medical conditions you have.  Whether you are pregnant or may be pregnant. What are the risks? Generally, this is a safe test. However, problems may occur, including an allergic reaction to dye (contrast) that may be used during the test. What happens before the test? No specific preparation is needed. You may eat and drink normally. What happens during the test?  You will take off your clothes from the waist up and put on a hospital gown.  Electrodes or electrocardiogram (ECG)patches may be placed on your chest. The electrodes or patches are then connected to a device that monitors your heart rate and rhythm.  You will lie down on a table for an ultrasound exam. A gel will be applied to your chest to help sound waves pass through your skin.  A handheld device, called a transducer, will be pressed against your chest and moved over your heart. The transducer produces sound waves that travel to your heart and bounce back (or "echo" back)  to the transducer. These sound waves will be captured in real-time and changed into images of your heart that can be viewed on a video monitor. The images will be recorded on a computer and reviewed by your health care provider.  You may be asked to change positions or hold your breath for a short time. This makes it easier to get different views or better views of your heart.  In some cases, you may receive contrast through an IV in one of your veins. This can improve the quality of the pictures from your heart. The procedure may vary among health care providers and hospitals.   What can I expect after the test? You may return to your normal, everyday life, including diet, activities, and medicines, unless your health care provider tells you not to do that. Follow these  instructions at home:  It is up to you to get the results of your test. Ask your health care provider, or the department that is doing the test, when your results will be ready.  Keep all follow-up visits. This is important. Summary  An echocardiogram is a test that uses sound waves (ultrasound) to produce images of the heart.  Images from an echocardiogram can provide important information about the size and shape of your heart, heart muscle function, heart valve function, and other possible heart problems.  You do not need to do anything to prepare before this test. You may eat and drink normally.  After the echocardiogram is completed, you may return to your normal, everyday life, unless your health care provider tells you not to do that. This information is not intended to replace advice given to you by your health care provider. Make sure you discuss any questions you have with your health care provider. Document Revised: 03/22/2020 Document Reviewed: 03/22/2020 Elsevier Patient Education  2021 [employer].

## 2020-11-18 LAB — HEMOGLOBIN A1C
Est. average glucose Bld gHb Est-mCnc: 108 mg/dL
Hgb A1c MFr Bld: 5.4 % (ref 4.8–5.6)

## 2020-12-01 MED ORDER — CARVEDILOL 12.5 MG PO TABS: 12.5000 mg | ORAL_TABLET | Freq: Two times a day (BID) | ORAL | 3 refills | Status: DC

## 2020-12-02 ENCOUNTER — Telehealth: Payer: Self-pay

## 2020-12-02 ENCOUNTER — Telehealth: Payer: Self-pay | Admitting: Cardiology

## 2020-12-02 MED ORDER — CARVEDILOL 6.25 MG PO TABS: 6.2500 mg | ORAL_TABLET | Freq: Two times a day (BID) | ORAL | 3 refills | Status: DC

## 2020-12-02 MED ORDER — HYDRALAZINE HCL 25 MG PO TABS: 12.5000 mg | ORAL_TABLET | Freq: Two times a day (BID) | ORAL | 1 refills | Status: DC

## 2020-12-02 NOTE — Telephone Encounter (Signed)
Spoke with patient's wife, see chart.

## 2020-12-02 NOTE — Telephone Encounter (Signed)
Pt c/o BP issue: STAT if pt c/o blurred vision, one-sided weakness or slurred speech  1. What are your last 5 BP readings? After taking two pills dropped 110/63 140/104  2. Are you having any other symptoms (ex. Dizziness, headache, blurred vision, passed out)? Dizziness  3. What is your BP issue? Wife calling in with concerns about Trystyn's BP.she also states is there something else he can be put on other than the medication he is on.

## 2020-12-02 NOTE — Telephone Encounter (Signed)
Spoke to the patients wife just now and she let me know that the blood pressure one hour after the patients medications this morning was 166/109. However, the patient just started the carvedilol increased dose last night. She states that the patient just feels bad and is dizzy. The dizziness he is experiencing comes and goes in waves per the wife. She denies any chest pain/SOB.   She is wanting to know what they should do from here as the patient did not go to work today because he did not feel comfortable driving.   I will route to Dr. Harriet Masson for further advise.

## 2020-12-02 NOTE — Telephone Encounter (Signed)
Spoke with patient's wife about decreasing Coreg to 6.25 mg twice daily and adding Hydralazine 12.5 mg twice daily. She verbalized understanding. She read the instructions back to me. No questions or concerns at this time.

## 2020-12-08 ENCOUNTER — Telehealth (HOSPITAL_COMMUNITY): Payer: Self-pay | Admitting: *Deleted

## 2020-12-08 NOTE — Telephone Encounter (Signed)
Reaching out to patient to offer assistance regarding upcoming cardiac imaging study; pt verbalizes understanding of appt date/time, parking situation and where to check in, pre-test NPO status, and verified current allergies; name and call back number provided for further questions should they arise  Gordy Clement RN Navigator Cardiac [place] [facility] [phone] office [phone] cell  Pt to get labs drawn at Dr. Terrial Rhodes office prior to cardiac CT scan.

## 2020-12-09 ENCOUNTER — Telehealth (HOSPITAL_COMMUNITY): Payer: Self-pay | Admitting: *Deleted

## 2020-12-09 ENCOUNTER — Telehealth: Payer: Self-pay

## 2020-12-09 LAB — BASIC METABOLIC PANEL
BUN/Creatinine Ratio: 11 (ref 9–20)
BUN: 13 mg/dL (ref 6–24)
CO2: 25 mmol/L (ref 20–29)
Calcium: 9.2 mg/dL (ref 8.7–10.2)
Chloride: 102 mmol/L (ref 96–106)
Creatinine, Ser: 1.2 mg/dL (ref 0.76–1.27)
Glucose: 104 mg/dL — ABNORMAL HIGH (ref 65–99)
Potassium: 4.1 mmol/L (ref 3.5–5.2)
Sodium: 142 mmol/L (ref 134–144)
eGFR: 74 mL/min/{1.73_m2} (ref >59)

## 2020-12-09 LAB — MAGNESIUM: Magnesium: 2.1 mg/dL (ref 1.6–2.3)

## 2020-12-09 NOTE — Telephone Encounter (Signed)
Called pt and left message to instruct pt to take evening dose of carvedilol 2 hours prior to cardiac CT scan on Monday Dec 12, 2020.  Call back number given in case patient has questions.  Eli Hose Office: [phone]

## 2020-12-09 NOTE — Telephone Encounter (Signed)
Per request of Gordy Clement, RN patient's vital signs completed today by [facility], CMA. Blood pressure 152/102  Heart Rate 62 Oxygen sat: 96%

## 2020-12-12 ENCOUNTER — Other Ambulatory Visit: Payer: Self-pay

## 2020-12-12 ENCOUNTER — Encounter (HOSPITAL_COMMUNITY): Payer: Self-pay

## 2020-12-12 ENCOUNTER — Ambulatory Visit (HOSPITAL_COMMUNITY)
Admission: RE | Admit: 2020-12-12 | Discharge: 2020-12-12 | Disposition: A | Discharge status: Home / Self Care | Payer: Commercial Managed Care - PPO | Source: Ambulatory Visit | Attending: Cardiology | Admitting: Cardiology

## 2020-12-12 DIAGNOSIS — R079 Chest pain, unspecified: Secondary | ICD-10-CM | POA: Insufficient documentation

## 2020-12-12 DIAGNOSIS — Z006 Encounter for examination for normal comparison and control in clinical research program: Secondary | ICD-10-CM

## 2020-12-12 MED ORDER — METOPROLOL TARTRATE 5 MG/5ML IV SOLN: 5.0000 mg | Freq: Once | INTRAVENOUS | Status: AC

## 2020-12-12 MED ORDER — NITROGLYCERIN 0.4 MG SL SUBL
SUBLINGUAL_TABLET | SUBLINGUAL | Status: AC
  Filled 2020-12-12: qty 2

## 2020-12-12 MED ORDER — NITROGLYCERIN 0.4 MG SL SUBL
0.8000 mg | SUBLINGUAL_TABLET | Freq: Once | SUBLINGUAL | Status: AC
  Administered 2020-12-12: 0.8 mg via SUBLINGUAL

## 2020-12-12 MED ORDER — METOPROLOL TARTRATE 5 MG/5ML IV SOLN
INTRAVENOUS | Status: AC
  Administered 2020-12-12: 5 mg via INTRAVENOUS
  Filled 2020-12-12: qty 10

## 2020-12-12 MED ORDER — IOHEXOL 350 MG/ML SOLN
95.0000 mL | Freq: Once | INTRAVENOUS | Status: AC | PRN
  Administered 2020-12-12: 95 mL via INTRAVENOUS

## 2020-12-12 MED ORDER — METOPROLOL TARTRATE 5 MG/5ML IV SOLN
5.0000 mg | Freq: Once | INTRAVENOUS | Status: AC
  Administered 2020-12-12: 5 mg via INTRAVENOUS

## 2020-12-12 NOTE — Research (Signed)
IDENTIFY Informed Consent                  Subject Name: Timothy Small    Subject met inclusion and exclusion criteria.  The informed consent form, study requirements and expectations were reviewed with the subject and questions and concerns were addressed prior to the signing of the consent form.  The subject verbalized understanding of the trial requirements.  The subject agreed to participate in the IDENTIFY trial and signed the informed consent at 15:51PM on 12/12/20.  The informed consent was obtained prior to performance of any protocol-specific procedures for the subject.  A copy of the signed informed consent was given to the subject and a copy was placed in the subject's medical record.   Meade Maw , Naval architect

## 2020-12-12 NOTE — Progress Notes (Signed)
Patient tolerated CT well. Gave water to patient to drink. Vital signs stable encourage to drink water throughout day.Reasons explained and verbalized understanding. Ambulated steady gait.

## 2020-12-13 ENCOUNTER — Ambulatory Visit (INDEPENDENT_AMBULATORY_CARE_PROVIDER_SITE_OTHER): Payer: Commercial Managed Care - PPO

## 2020-12-13 DIAGNOSIS — R0602 Shortness of breath: Secondary | ICD-10-CM | POA: Diagnosis not present

## 2020-12-13 LAB — ECHOCARDIOGRAM COMPLETE
Area-P 1/2: 3 cm2
Calc EF: 47.9 %
S' Lateral: 3.7 cm
Single Plane A2C EF: 46.4 %
Single Plane A4C EF: 49.9 %

## 2020-12-19 ENCOUNTER — Other Ambulatory Visit: Payer: Self-pay

## 2020-12-19 ENCOUNTER — Ambulatory Visit: Payer: Commercial Managed Care - PPO | Admitting: Cardiology

## 2020-12-19 VITALS — BP 130/86 | HR 74 | Ht 71.0 in | Wt 253.2 lb

## 2020-12-19 DIAGNOSIS — E669 Obesity, unspecified: Secondary | ICD-10-CM

## 2020-12-19 DIAGNOSIS — R931 Abnormal findings on diagnostic imaging of heart and coronary circulation: Secondary | ICD-10-CM

## 2020-12-19 DIAGNOSIS — R0989 Other specified symptoms and signs involving the circulatory and respiratory systems: Secondary | ICD-10-CM

## 2020-12-19 DIAGNOSIS — G4733 Obstructive sleep apnea (adult) (pediatric): Secondary | ICD-10-CM

## 2020-12-19 DIAGNOSIS — I2510 Atherosclerotic heart disease of native coronary artery without angina pectoris: Secondary | ICD-10-CM | POA: Diagnosis not present

## 2020-12-19 DIAGNOSIS — I10 Essential (primary) hypertension: Secondary | ICD-10-CM

## 2020-12-19 DIAGNOSIS — I471 Supraventricular tachycardia: Secondary | ICD-10-CM | POA: Diagnosis not present

## 2020-12-19 DIAGNOSIS — I4719 Other supraventricular tachycardia: Secondary | ICD-10-CM

## 2020-12-19 MED ORDER — CARVEDILOL 12.5 MG PO TABS: 12.5000 mg | ORAL_TABLET | Freq: Two times a day (BID) | ORAL | 3 refills | Status: DC

## 2020-12-19 NOTE — Patient Instructions (Addendum)
Medication Instructions:  Your physician has recommended you make the following change in your medication: INCREASE: Coreg 12.5 mg twice daily *If you need a refill on your cardiac medications before your next appointment, please call your pharmacy*   Lab Work: None If you have labs (blood work) drawn today and your tests are completely normal, you will receive your results only by: Marland Kitchen MyChart Message (if you have MyChart) OR . A paper copy in the mail If you have any lab test that is abnormal or we need to change your treatment, we will call you to review the results.   Testing/Procedures: None   Follow-Up: At [facility], you and your health needs are our priority.  As part of our continuing mission to provide you with exceptional heart care, we have created designated Provider Care Teams.  These Care Teams include your primary Cardiologist (physician) and Advanced Practice Providers (APPs -  Physician Assistants and Nurse Practitioners) who all work together to provide you with the care you need, when you need it.  We recommend signing up for the patient portal called "MyChart".  Sign up information is provided on this After Visit Summary.  MyChart is used to connect with patients for Virtual Visits (Telemedicine).  Patients are able to view lab/test results, encounter notes, upcoming appointments, etc.  Non-urgent messages can be sent to your provider as well.   To learn more about what you can do with MyChart, go to [email or web address].    Your next appointment:   3 month(s)  The format for your next appointment:   In Person  Provider:   Berniece Salines, DO   Other Instructions

## 2020-12-19 NOTE — Progress Notes (Signed)
Cardiology Office Note:    Date:  12/19/2020   ID:  Timothy Small, DOB [birth year], MRN [id number]  PCP:  Lillard Anes, MD  Cardiologist:  Berniece Salines, DO  Electrophysiologist:  None   Referring MD: Lillard Anes,*   My blood pressure is still high at home.  History of Present Illness:    Timothy Small is a 50 y.o. male with a hx of hypertension, obstructive sleep apnea postsurgery, obesity is here today for follow-up visit.  Did see the patient for the first time in April 2022 at that time he had some chest pain as well as a hypertensive.  During our first visit the patient was hypertensive reported chest pain and some dizziness.  I sent the patient for coronary CTA, an echocardiogram as well as placed a monitor him.  Since I saw the patient we have altered his medications for his antihypertensive regimen.  He is here today to discuss his results.  Past Medical History:  Diagnosis Date  . COPD (chronic obstructive pulmonary disease) ([place])   . ED (erectile dysfunction)   . Hypertension   . OSA (obstructive sleep apnea)    s/p  osa surgery 2011-- sleep study repeated after surgery per pt stated mild osa no cpap  . Renal cyst 2010   multiple  . Right ureteral stone     Past Surgical History:  Procedure Laterality Date  . CYSTOSCOPY/RETROGRADE/URETEROSCOPY/STONE EXTRACTION WITH BASKET Right 12/23/2015   Procedure: CYSTOSCOPY/RIGHT RETROGRADE/ PYELOGRAM URETEROSCOPY/STONE EXTRACTION WITH BASKET;  Surgeon: Kathie Rhodes, MD;  Location: [facility];  Service: Urology;  Laterality: Right;  . UVULOPALATOPHARYNGOPLASTY  2011   and Tonsillectomy/ Adenoidectomy for sleep apnea    Current Medications: Current Meds  Medication Sig  . atorvastatin (LIPITOR) 40 MG tablet Take 1 tablet (40 mg total) by mouth daily.  . carvedilol (COREG) 12.5 MG tablet Take 1 tablet (12.5 mg total) by mouth 2 (two) times daily.  . hydrALAZINE (APRESOLINE) 25 MG  tablet Take 0.5 tablets (12.5 mg total) by mouth in the morning and at bedtime.  . hydrochlorothiazide (HYDRODIURIL) 25 MG tablet TAKE 1 TABLET BY MOUTH EVERY DAY  . potassium chloride (KLOR-CON) 20 MEQ packet Take 20 mEq by mouth once.  . valsartan (DIOVAN) 320 MG tablet Take 1 tablet (320 mg total) by mouth daily.  . [DISCONTINUED] carvedilol (COREG) 6.25 MG tablet Take 1 tablet (6.25 mg total) by mouth 2 (two) times daily with a meal.     Allergies:   Patient has no known allergies.   Social History   Socioeconomic History  . Marital status: Married    Spouse name: [name]  . Number of children: 2  . Years of education: Not on file  . Highest education level: Not on file  Occupational History    Employer: [employer]  Tobacco Use  . Smoking status: Never Smoker  . Smokeless tobacco: Current User    Types: Snuff  . Tobacco comment: 1 can every two days  Substance and Sexual Activity  . Alcohol use: Yes    Alcohol/week: 4.0 standard drinks    Types: 4 Cans of beer per week    Comment: socially  . Drug use: No  . Sexual activity: Yes  Other Topics Concern  . Not on file  Social History Narrative  . Not on file   Social Determinants of Health   Financial Resource Strain: Not on file  Food Insecurity: Not on file  Transportation Needs: Not on  file  Physical Activity: Not on file  Stress: Not on file  Social Connections: Not on file     Family History: The patient's family history includes Cancer in his father; Heart disease in his maternal grandfather, maternal grandmother, and maternal uncle; Heart disease (age of onset: 56) in his maternal aunt; Hypertension in his brother.  ROS:   Review of Systems  Constitution: Negative for decreased appetite, fever and weight gain.  HENT: Negative for congestion, ear discharge, hoarse voice and sore throat.   Eyes: Negative for discharge, redness, vision loss in right eye and visual halos.  Cardiovascular: Negative for chest pain,  dyspnea on exertion, leg swelling, orthopnea and palpitations.  Respiratory: Negative for cough, hemoptysis, shortness of breath and snoring.   Endocrine: Negative for heat intolerance and polyphagia.  Hematologic/Lymphatic: Negative for bleeding problem. Does not bruise/bleed easily.  Skin: Negative for flushing, nail changes, rash and suspicious lesions.  Musculoskeletal: Negative for arthritis, joint pain, muscle cramps, myalgias, neck pain and stiffness.  Gastrointestinal: Negative for abdominal pain, bowel incontinence, diarrhea and excessive appetite.  Genitourinary: Negative for decreased libido, genital sores and incomplete emptying.  Neurological: Negative for brief paralysis, focal weakness, headaches and loss of balance.  Psychiatric/Behavioral: Negative for altered mental status, depression and suicidal ideas.  Allergic/Immunologic: Negative for HIV exposure and persistent infections.    EKGs/Labs/Other Studies Reviewed:    The following studies were reviewed today:   EKG:  None today  Zio monitor The patient wore the monitor for 9 days 10 hours, starting November 17, 2020. Indication: Palpitations  The minimum heart rate was 42 bpm, maximum heart rate was 200 bpm, and average heart rate was 75 bpm. Predominant underlying rhythm was Sinus Rhythm.  70 Supraventricular Tachycardia runs occurred, the run with the fastest interval lasting 6 beats with a max rate of 200 bpm, the longest lasting 12 beats with an avg rate of 115 bpm.  Premature atrial complexes were occasional (3.1%, 31125). SVE Couplets were occasional (1.1%, 5363). Premature Ventricular complexes rare less than 1%.  No ventricular tachycardia, no pauses, No AV block and no atrial fibrillation present.  3 patient triggered events and 3 diary events were noted all associated with sinus rhythm and premature atrial complex.  Conclusion: This study is remarkable for the following:                                   1.  Paroxysmal supraventricular tachycardia which is likely atrial tachycardia with variable block.                                  2.  Occasional premature atrial complexes.  TTE 12/13/2020 IMPRESSIONS  1. Left ventricular ejection fraction, by estimation, is 45 to 50%. The  left ventricle has mildly decreased function. The left ventricle has no  regional wall motion abnormalities. There is moderate concentric left  ventricular hypertrophy. Left  ventricular diastolic parameters were normal. The average left ventricular  global longitudinal strain is -5.7 %. The global longitudinal strain is  abnormal.  2. Right ventricular systolic function is normal. The right ventricular  size is normal. There is normal pulmonary artery systolic pressure.  3. The mitral valve is normal in structure. No evidence of mitral valve  regurgitation. No evidence of mitral stenosis.  4. The aortic valve is normal in structure. Aortic valve  regurgitation is  not visualized. No aortic stenosis is present.  5. Aortic dilatation noted. Aneurysm of the aortic root, measuring 42 mm.  There is mild dilatation of the ascending aorta, measuring 37 mm.  6. The inferior vena cava is normal in size with greater than 50%  respiratory variability, suggesting right atrial pressure of 3 mmHg.   FINDINGS  Left Ventricle: Left ventricular ejection fraction, by estimation, is 45  to 50%. The left ventricle has mildly decreased function. The left  ventricle has no regional wall motion abnormalities. The average left  ventricular global longitudinal strain is  -5.7 %. The global longitudinal strain is abnormal. The left ventricular  internal cavity size was normal in size. There is moderate concentric left  ventricular hypertrophy. Left ventricular diastolic parameters were  normal.   Right Ventricle: The right ventricular size is normal. No increase in  right ventricular wall thickness. Right ventricular systolic  function is  normal. There is normal pulmonary artery systolic pressure. The tricuspid  regurgitant velocity is 1.71 m/s, and  with an assumed right atrial pressure of 3 mmHg, the estimated right  ventricular systolic pressure is [id number] mmHg.   Left Atrium: Left atrial size was normal in size.   Right Atrium: Right atrial size was normal in size.   Pericardium: There is no evidence of pericardial effusion. Presence of  pericardial fat pad.   Mitral Valve: The mitral valve is normal in structure. No evidence of  mitral valve regurgitation. No evidence of mitral valve stenosis.   Tricuspid Valve: The tricuspid valve is normal in structure. Tricuspid  valve regurgitation is trivial. No evidence of tricuspid stenosis.   Aortic Valve: The aortic valve is normal in structure. There is mild  aortic valve annular calcification. Aortic valve regurgitation is not  visualized. No aortic stenosis is present.   Pulmonic Valve: The pulmonic valve was normal in structure. Pulmonic valve  regurgitation is not visualized. No evidence of pulmonic stenosis.   Aorta: Aortic dilatation noted. There is mild dilatation of the ascending  aorta, measuring 37 mm. There is an aneurysm involving the aortic root  measuring 42 mm.   Venous: The inferior vena cava is normal in size with greater than 50%  respiratory variability, suggesting right atrial pressure of 3 mmHg.   IAS/Shunts: No atrial level shunt detected by color flow Doppler.   CCTA 12/12/2020  Calcium Score: No calcium noted  Coronary Arteries: Right dominant with no anomalies  LM: Normal  LAD: 1-24% proximal LAD soft plaque at D 1 take off  IM: Normal  D1: Normal  D2: Normal  D3: Normal  Circumflex: Normal  OM1: Normal  OM2: Normal  RCA: Normal  PDA: Normal  PLA: Norma  IMPRESSION: 1. Calcium score 0  2.  CAD RADS 1 1-24% soft plaque in proximal LAD at D1 take off  3.  Normal aortic root diameter 3.7  cm   Recent Labs: 09/19/2020: Hemoglobin 17.1; Platelets 216 11/10/2020: ALT 34 12/09/2020: BUN 13; Creatinine, Ser 1.20; Magnesium 2.1; Potassium 4.1; Sodium 142  Recent Lipid Panel    Component Value Date/Time   CHOL 132 09/19/2020 0801   TRIG 133 09/19/2020 0801   HDL 36 (L) 09/19/2020 0801   CHOLHDL 3.7 09/19/2020 0801   CHOLHDL 3 09/12/2016 1529   VLDL 46.2 (H) 09/12/2016 1529   LDLCALC 72 09/19/2020 0801   LDLDIRECT 77.0 09/12/2016 1529    Physical Exam:    VS:  BP 130/86   Pulse 74  Ht 5\' 11"  (1.803 m)   Wt 253 lb 3.2 oz (114.9 kg)   SpO2 98%   BMI 35.31 kg/m     Wt Readings from Last 3 Encounters:  12/19/20 253 lb 3.2 oz (114.9 kg)  11/17/20 248 lb (112.5 kg)  11/01/20 245 lb (111.1 kg)     GEN: Well nourished, well developed in no acute distress HEENT: Normal NECK: No JVD; No carotid bruits LYMPHATICS: No lymphadenopathy CARDIAC: S1S2 noted,RRR, no murmurs, rubs, gallops RESPIRATORY:  Clear to auscultation without rales, wheezing or rhonchi  ABDOMEN: Soft, non-tender, non-distended, +bowel sounds, no guarding. EXTREMITIES: No edema, No cyanosis, no clubbing MUSCULOSKELETAL:  No deformity  SKIN: Warm and dry NEUROLOGIC:  Alert and oriented x 3, non-focal PSYCHIATRIC:  Normal affect, good insight  ASSESSMENT:    1. Hypertension, unspecified type   2. Depressed left ventricular ejection fraction   3. PAT (paroxysmal atrial tachycardia) ([place])   4. Mild CAD   5. Obstructive sleep apnea syndrome   6. Obesity (BMI 30-39.9)    PLAN:     I talked to the patient about his testing results.  We talked about his echocardiogram which showed mildly depressed ejection fraction 45 to 50%.  Explained to the patient his wife who is face timing during his visit about what this meant.  I did discuss with him that I suspect that hypertensive heart disease may be playing a role with his mildly depressed ejection fraction.  For now the goal will be to try to improve his  blood pressure and keep it less than 130/80 mmHg.  With this the patient disclosed to me that his blood pressure at home is still elevated.  His systolics are running in the 0000000 and his diastolic at home is usually running in the 101.  In addition to his episodes of atrial tachycardia his monitor and his elevated blood pressure plan to increase his carvedilol to 12.5 mg twice a day.    His current regimen includes hydralazine 12.5 mg twice daily, now carvedilol will be 12.5 mg twice a day, hydrochlorothiazide 25 mg daily and valsartan 320 mg daily. We discussed his blood pressure goals.  Hopefully if this improve this regimen will remain if not we will optimize his antihypertensive regimen as appropriate.  I discussed the coronary CTA results with the patient which show mild coronary artery disease.  Explained to patient what this means.  He is on Lipitor 40 mg daily we will keep that for now.  Will monitor patient closely if dizziness persist I will prefer to also send the patient to be evaluated by ENT and likely neuro.  Lifestyle modification advised.  The patient is in agreement with the above plan. The patient left the office in stable condition.  The patient will follow up in 3 months or sooner if needed.   Medication Adjustments/Labs and Tests Ordered: Current medicines are reviewed at length with the patient today.  Concerns regarding medicines are outlined above.  No orders of the defined types were placed in this encounter.  Meds ordered this encounter  Medications  . carvedilol (COREG) 12.5 MG tablet    Sig: Take 1 tablet (12.5 mg total) by mouth 2 (two) times daily.    Dispense:  180 tablet    Refill:  3    Patient Instructions  Medication Instructions:  Your physician has recommended you make the following change in your medication: INCREASE: Coreg 12.5 mg twice daily *If you need a refill on your cardiac  medications before your next appointment, please call your  pharmacy*   Lab Work: None If you have labs (blood work) drawn today and your tests are completely normal, you will receive your results only by: Marland Kitchen MyChart Message (if you have MyChart) OR . A paper copy in the mail If you have any lab test that is abnormal or we need to change your treatment, we will call you to review the results.   Testing/Procedures: None   Follow-Up: At [facility], you and your health needs are our priority.  As part of our continuing mission to provide you with exceptional heart care, we have created designated Provider Care Teams.  These Care Teams include your primary Cardiologist (physician) and Advanced Practice Providers (APPs -  Physician Assistants and Nurse Practitioners) who all work together to provide you with the care you need, when you need it.  We recommend signing up for the patient portal called "MyChart".  Sign up information is provided on this After Visit Summary.  MyChart is used to connect with patients for Virtual Visits (Telemedicine).  Patients are able to view lab/test results, encounter notes, upcoming appointments, etc.  Non-urgent messages can be sent to your provider as well.   To learn more about what you can do with MyChart, go to [email or web address].    Your next appointment:   3 month(s)  The format for your next appointment:   In Person  Provider:   Berniece Salines, DO   Other Instructions      Adopting a Healthy Lifestyle.  Know what a healthy weight is for you (roughly BMI <25) and aim to maintain this   Aim for 7+ servings of fruits and vegetables daily   65-80+ fluid ounces of water or unsweet tea for healthy kidneys   Limit to max 1 drink of alcohol per day; avoid smoking/tobacco   Limit animal fats in diet for cholesterol and heart health - choose grass fed whenever available   Avoid highly processed foods, and foods high in saturated/trans fats   Aim for low stress - take time to unwind and care for  your mental health   Aim for 150 min of moderate intensity exercise weekly for heart health, and weights twice weekly for bone health   Aim for 7-9 hours of sleep daily   When it comes to diets, agreement about the perfect plan isnt easy to find, even among the experts. Experts at the [employer] developed an idea known as the Healthy Eating Plate. Just imagine a plate divided into logical, healthy portions.   The emphasis is on diet quality:   Load up on vegetables and fruits - one-half of your plate: Aim for color and variety, and remember that potatoes dont count.   Go for whole grains - one-quarter of your plate: Whole wheat, barley, wheat berries, quinoa, oats, brown rice, and foods made with them. If you want pasta, go with whole wheat pasta.   Protein power - one-quarter of your plate: Fish, chicken, beans, and nuts are all healthy, versatile protein sources. Limit red meat.   The diet, however, does go beyond the plate, offering a few other suggestions.   Use healthy plant oils, such as olive, canola, soy, corn, sunflower and peanut. Check the labels, and avoid partially hydrogenated oil, which have unhealthy trans fats.   If youre thirsty, drink water. Coffee and tea are good in moderation, but skip sugary drinks and limit milk and dairy products to  one or two daily servings.   The type of carbohydrate in the diet is more important than the amount. Some sources of carbohydrates, such as vegetables, fruits, whole grains, and beans-are healthier than others.   Finally, stay active  Signed, Berniece Salines, DO  12/19/2020 9:43 AM    [place]

## 2021-01-02 ENCOUNTER — Ambulatory Visit: Payer: Commercial Managed Care - PPO | Admitting: Cardiology

## 2021-01-21 ENCOUNTER — Other Ambulatory Visit: Payer: Self-pay | Admitting: Legal Medicine

## 2021-01-21 DIAGNOSIS — I10 Essential (primary) hypertension: Secondary | ICD-10-CM

## 2021-01-21 DIAGNOSIS — E782 Mixed hyperlipidemia: Secondary | ICD-10-CM

## 2021-01-29 ENCOUNTER — Other Ambulatory Visit: Payer: Self-pay | Admitting: Legal Medicine

## 2021-01-29 DIAGNOSIS — E876 Hypokalemia: Secondary | ICD-10-CM

## 2021-02-28 ENCOUNTER — Other Ambulatory Visit: Payer: Self-pay | Admitting: Cardiology

## 2021-03-01 ENCOUNTER — Other Ambulatory Visit: Payer: Self-pay | Admitting: Legal Medicine

## 2021-03-01 DIAGNOSIS — E876 Hypokalemia: Secondary | ICD-10-CM

## 2021-03-13 ENCOUNTER — Telehealth: Payer: Self-pay | Admitting: *Deleted

## 2021-03-13 NOTE — Telephone Encounter (Signed)
I called patient for 90 day Identify Study. Patient is doing well. I reminded patient I would call him in May 2023 for 1 -year follow-up

## 2021-03-20 ENCOUNTER — Encounter: Payer: Self-pay | Admitting: Legal Medicine

## 2021-03-20 ENCOUNTER — Other Ambulatory Visit: Payer: Self-pay

## 2021-03-20 ENCOUNTER — Ambulatory Visit: Payer: Commercial Managed Care - PPO | Admitting: Legal Medicine

## 2021-03-20 VITALS — BP 132/80 | HR 74 | Temp 98.0°F | Ht 71.0 in | Wt 249.8 lb

## 2021-03-20 DIAGNOSIS — I10 Essential (primary) hypertension: Secondary | ICD-10-CM

## 2021-03-20 DIAGNOSIS — J449 Chronic obstructive pulmonary disease, unspecified: Secondary | ICD-10-CM

## 2021-03-20 DIAGNOSIS — J479 Bronchiectasis, uncomplicated: Secondary | ICD-10-CM

## 2021-03-20 DIAGNOSIS — G4733 Obstructive sleep apnea (adult) (pediatric): Secondary | ICD-10-CM | POA: Diagnosis not present

## 2021-03-20 DIAGNOSIS — Z1211 Encounter for screening for malignant neoplasm of colon: Secondary | ICD-10-CM

## 2021-03-20 DIAGNOSIS — E782 Mixed hyperlipidemia: Secondary | ICD-10-CM

## 2021-03-20 NOTE — Progress Notes (Signed)
Established Patient Office Visit  Subjective:  Patient ID: Timothy Small, male    DOB: March 08, 1971  Age: 50 y.o. MRN: [id number]  CC:  Chief Complaint  Patient presents with   Hypertension   COPD   Hyperlipidemia    HPI [name] presents for chronic visit  Patient presents for follow up of hypertension.  Patient tolerating carvidilol and hydralazine, HCTZ well with side effects.  Patient was diagnosed with hypertension 2010 so has been treated for hypertension for 10 years.Patient is working on maintaining diet and exercise regimen and follows up as directed. Complication include none  Patient presents with hyperlipidemia.  Compliance with treatment has been good; patient takes medicines as directed, maintains low cholesterol diet, follows up as directed, and maintains exercise regimen.  Patient is using atorvastatin without problems. .   Past Medical History:  Diagnosis Date   COPD (chronic obstructive pulmonary disease) ([place])    ED (erectile dysfunction)    Hypertension    OSA (obstructive sleep apnea)    s/p  osa surgery 2011-- sleep study repeated after surgery per pt stated mild osa no cpap   Renal cyst 2010   multiple   Right ureteral stone     Past Surgical History:  Procedure Laterality Date   CYSTOSCOPY/RETROGRADE/URETEROSCOPY/STONE EXTRACTION WITH BASKET Right 12/23/2015   Procedure: CYSTOSCOPY/RIGHT RETROGRADE/ PYELOGRAM URETEROSCOPY/STONE EXTRACTION WITH BASKET;  Surgeon: Kathie Rhodes, MD;  Location: [facility];  Service: Urology;  Laterality: Right;   UVULOPALATOPHARYNGOPLASTY  2011   and Tonsillectomy/ Adenoidectomy for sleep apnea    Family History  Problem Relation Age of Onset   Cancer Father        bladder Ca    Heart disease Maternal Aunt 23       heart failure   Heart disease Maternal Uncle    Heart disease Maternal Grandmother    Heart disease Maternal Grandfather    Hypertension Brother     Social History    Socioeconomic History   Marital status: Married    Spouse name: [name]   Number of children: 2   Years of education: Not on file   Highest education level: Not on file  Occupational History    Employer: [employer]  Tobacco Use   Smoking status: Never   Smokeless tobacco: Current    Types: Snuff   Tobacco comments:    1 can every two days  Substance and Sexual Activity   Alcohol use: Yes    Alcohol/week: 4.0 standard drinks    Types: 4 Cans of beer per week    Comment: socially   Drug use: No   Sexual activity: Yes  Other Topics Concern   Not on file  Social History Narrative   Not on file   Social Determinants of Health   Financial Resource Strain: Not on file  Food Insecurity: Not on file  Transportation Needs: Not on file  Physical Activity: Not on file  Stress: Not on file  Social Connections: Not on file  Intimate Partner Violence: Not on file    Outpatient Medications Prior to Visit  Medication Sig Dispense Refill   atorvastatin (LIPITOR) 40 MG tablet TAKE 1 TABLET BY MOUTH EVERY DAY 90 tablet 2   carvedilol (COREG) 12.5 MG tablet Take 1 tablet (12.5 mg total) by mouth 2 (two) times daily. 180 tablet 3   hydrALAZINE (APRESOLINE) 25 MG tablet Take 0.5 tablets (12.5 mg total) by mouth in the morning and at bedtime. 90 tablet 2  hydrochlorothiazide (HYDRODIURIL) 25 MG tablet TAKE 1 TABLET BY MOUTH EVERY DAY 90 tablet 2   KLOR-CON M20 20 MEQ tablet TAKE 1 TABLET BY MOUTH EVERY DAY 90 tablet 1   potassium chloride (KLOR-CON) 20 MEQ packet Take 20 mEq by mouth once.     valsartan (DIOVAN) 320 MG tablet TAKE 1 TABLET BY MOUTH EVERY DAY 90 tablet 2   amLODipine (NORVASC) 10 MG tablet TAKE 1 TABLET BY MOUTH EVERY DAY IN THE MORNING 90 tablet 2   No facility-administered medications prior to visit.    No Known Allergies  ROS Review of Systems  Constitutional:  Negative for chills, fatigue and fever.  HENT:  Negative for congestion, ear pain and sore throat.    Respiratory:  Negative for cough and shortness of breath.   Cardiovascular:  Negative for chest pain.  Gastrointestinal:  Negative for abdominal pain, constipation, diarrhea, nausea and vomiting.  Endocrine: Negative for polydipsia, polyphagia and polyuria.  Genitourinary:  Negative for dysuria and frequency.  Musculoskeletal:  Negative for arthralgias and myalgias.  Neurological:  Negative for dizziness and headaches.  Psychiatric/Behavioral:  Negative for dysphoric mood.        No dysphoria     Objective:    Physical Exam Vitals reviewed.  Constitutional:      Appearance: Normal appearance. He is obese.  HENT:     Head: Normocephalic and atraumatic.     Right Ear: Tympanic membrane, ear canal and external ear normal.     Left Ear: Tympanic membrane, ear canal and external ear normal.     Mouth/Throat:     Mouth: Mucous membranes are moist.     Pharynx: Oropharynx is clear.  Eyes:     Extraocular Movements: Extraocular movements intact.     Conjunctiva/sclera: Conjunctivae normal.     Pupils: Pupils are equal, round, and reactive to light.  Cardiovascular:     Rate and Rhythm: Normal rate and regular rhythm.     Pulses: Normal pulses.     Heart sounds: Normal heart sounds. No murmur heard.   No gallop.  Pulmonary:     Effort: Pulmonary effort is normal. No respiratory distress.     Breath sounds: No wheezing.  Abdominal:     General: Abdomen is flat. Bowel sounds are normal. There is no distension.     Tenderness: There is no abdominal tenderness.  Musculoskeletal:        General: Normal range of motion.     Cervical back: Normal range of motion.  Skin:    General: Skin is warm.     Capillary Refill: Capillary refill takes less than 2 seconds.  Neurological:     General: No focal deficit present.     Mental Status: He is alert and oriented to person, place, and time. Mental status is at baseline.  Psychiatric:        Mood and Affect: Mood normal.        Thought  Content: Thought content normal.    BP 132/80   Pulse 74   Temp 98 F (36.7 C)   Ht 5' 11"  (1.803 m)   Wt 249 lb 12.8 oz (113.3 kg)   SpO2 97%   BMI 34.84 kg/m  Wt Readings from Last 3 Encounters:  03/20/21 249 lb 12.8 oz (113.3 kg)  12/19/20 253 lb 3.2 oz (114.9 kg)  11/17/20 248 lb (112.5 kg)     Health Maintenance Due  Topic Date Due   COVID-19 Vaccine (1) Never done  Pneumococcal Vaccine 14-87 Years old (1 - PCV) Never done   HIV Screening  Never done   Zoster Vaccines- Shingrix (1 of 2) Never done   COLONOSCOPY (Pts 45-29yr Insurance coverage will need to be confirmed)  Never done   TETANUS/TDAP  09/23/2018   INFLUENZA VACCINE  03/13/2021    There are no preventive care reminders to display for this patient.  Lab Results  Component Value Date   TSH 1.47 09/12/2016   Lab Results  Component Value Date   WBC 4.8 09/19/2020   HGB 17.1 09/19/2020   HCT 49.6 09/19/2020   MCV 86 09/19/2020   PLT 216 09/19/2020   Lab Results  Component Value Date   NA 142 12/09/2020   K 4.1 12/09/2020   CO2 25 12/09/2020   GLUCOSE 104 (H) 12/09/2020   BUN 13 12/09/2020   CREATININE 1.20 12/09/2020   BILITOT 0.4 11/10/2020   ALKPHOS 74 11/10/2020   AST 13 11/10/2020   ALT 34 11/10/2020   PROT 6.9 11/10/2020   ALBUMIN 4.6 11/10/2020   CALCIUM 9.2 12/09/2020   EGFR 74 12/09/2020   GFR 78.39 09/12/2016   Lab Results  Component Value Date   CHOL 132 09/19/2020   Lab Results  Component Value Date   HDL 36 (L) 09/19/2020   Lab Results  Component Value Date   LDLCALC 72 09/19/2020   Lab Results  Component Value Date   TRIG 133 09/19/2020   Lab Results  Component Value Date   CHOLHDL 3.7 09/19/2020   Lab Results  Component Value Date   HGBA1C 5.4 11/17/2020      Assessment & Plan:   Problem List Items Addressed This Visit       Cardiovascular and Mediastinum   Essential hypertension - Primary   Relevant Orders   Comprehensive metabolic panel   CBC  with Differential/Platelet An individual hypertension care plan was established and reinforced today.  The patient's status was assessed using clinical findings on exam and labs or diagnostic tests. The patient's success at meeting treatment goals on disease specific evidence-based guidelines and found to be well controlled. SELF MANAGEMENT: The patient and I together assessed ways to personally work towards obtaining the recommended goals. RECOMMENDATIONS: avoid decongestants found in common cold remedies, decrease consumption of alcohol, perform routine monitoring of BP with home BP cuff, exercise, reduction of dietary salt, take medicines as prescribed, try not to miss doses and quit smoking.  Regular exercise and maintaining a healthy weight is needed.  Stress reduction may help. A CLINICAL SUMMARY including written plan identify barriers to care unique to individual due to social or financial issues.  We attempt to mutually creat solutions for individual and family understanding.      Respiratory   BRONCHIECTASIS W/O ACUTE EXACERBATION   Chronic obstructive pulmonary disease, [place] An individualize plan was formulated for care of COPD.  Treatment is evidence based.  She will continue on inhalers, avoid smoking and smoke.  Regular exercise with help with dyspnea. Routine follow ups and medication compliance is needed.      Other   HLD (hyperlipidemia)   Relevant Orders   Lipid panel AN INDIVIDUAL CARE PLAN for hyperlipidemia/ cholesterol was established and reinforced today.  The patient's status was assessed using clinical findings on exam, lab and other diagnostic tests. The patient's disease status was assessed based on evidence-based guidelines and found to be fair controlled. MEDICATIONS were reviewed. SELF MANAGEMENT GOALS have been discussed and patient's  success at attaining the goal of low cholesterol was assessed. RECOMMENDATION given include regular exercise 3  days a week and low cholesterol/low fat diet. CLINICAL SUMMARY including written plan to identify barriers unique to the patient due to social or economic  reasons was discussed.    Other Visit Diagnoses     Sleep apnea, obstructive     Not active, negative sleep study    Screening for colon cancer       Relevant Orders   Ambulatory referral to Gastroenterology Sent for colonscopy          Follow-up: Return in about 6 months (around 09/20/2021) for fasting.    Reinaldo Meeker, MD

## 2021-03-21 LAB — COMPREHENSIVE METABOLIC PANEL
ALT: 30 IU/L (ref 0–44)
AST: 15 IU/L (ref 0–40)
Albumin/Globulin Ratio: 2.3 — ABNORMAL HIGH (ref 1.2–2.2)
Albumin: 4.5 g/dL (ref 4.0–5.0)
Alkaline Phosphatase: 71 IU/L (ref 44–121)
BUN/Creatinine Ratio: 13 (ref 9–20)
BUN: 12 mg/dL (ref 6–24)
Bilirubin Total: 0.5 mg/dL (ref 0.0–1.2)
CO2: 24 mmol/L (ref 20–29)
Calcium: 9 mg/dL (ref 8.7–10.2)
Chloride: 103 mmol/L (ref 96–106)
Creatinine, Ser: 0.93 mg/dL (ref 0.76–1.27)
Globulin, Total: 2 g/dL (ref 1.5–4.5)
Glucose: 105 mg/dL — ABNORMAL HIGH (ref 65–99)
Potassium: 3.8 mmol/L (ref 3.5–5.2)
Sodium: 141 mmol/L (ref 134–144)
Total Protein: 6.5 g/dL (ref 6.0–8.5)
eGFR: 100 mL/min/{1.73_m2} (ref >59)

## 2021-03-21 LAB — CBC WITH DIFFERENTIAL/PLATELET
Basophils Absolute: 0 10*3/uL (ref 0.0–0.2)
Basos: 1 %
EOS (ABSOLUTE): 0.1 10*3/uL (ref 0.0–0.4)
Eos: 2 %
Hematocrit: 47.3 % (ref 37.5–51.0)
Hemoglobin: 16.4 g/dL (ref 13.0–17.7)
Immature Grans (Abs): 0 10*3/uL (ref 0.0–0.1)
Immature Granulocytes: 0 %
Lymphocytes Absolute: 0.9 10*3/uL (ref 0.7–3.1)
Lymphs: 17 %
MCH: 30.6 pg (ref 26.6–33.0)
MCHC: 34.7 g/dL (ref 31.5–35.7)
MCV: 88 fL (ref 79–97)
Monocytes Absolute: 0.5 10*3/uL (ref 0.1–0.9)
Monocytes: 10 %
Neutrophils Absolute: 3.7 10*3/uL (ref 1.4–7.0)
Neutrophils: 70 %
Platelets: 192 10*3/uL (ref 150–450)
RBC: 5.36 x10E6/uL (ref 4.14–5.80)
RDW: 12.6 % (ref 11.6–15.4)
WBC: 5.2 10*3/uL (ref 3.4–10.8)

## 2021-03-21 LAB — LIPID PANEL
Chol/HDL Ratio: 4.2 ratio (ref 0.0–5.0)
Cholesterol, Total: 146 mg/dL (ref 100–199)
HDL: 35 mg/dL — ABNORMAL LOW (ref >39)
LDL Chol Calc (NIH): 80 mg/dL (ref 0–99)
Triglycerides: 181 mg/dL — ABNORMAL HIGH (ref 0–149)
VLDL Cholesterol Cal: 31 mg/dL (ref 5–40)

## 2021-03-21 LAB — CARDIOVASCULAR RISK ASSESSMENT

## 2021-03-21 NOTE — Progress Notes (Signed)
Glucose 105, kidney and liver tests, triglycerides high 181, CBC normal,  lp

## 2021-03-22 ENCOUNTER — Other Ambulatory Visit: Payer: Self-pay

## 2021-03-22 MED ORDER — CARVEDILOL 25 MG PO TABS: 25.0000 mg | ORAL_TABLET | Freq: Two times a day (BID) | ORAL | 3 refills | Status: DC

## 2021-03-22 MED ORDER — HYDRALAZINE HCL 25 MG PO TABS: 12.5000 mg | ORAL_TABLET | Freq: Three times a day (TID) | ORAL | 2 refills | Status: DC

## 2021-03-22 NOTE — Progress Notes (Signed)
Prescription sent to pharmacy.

## 2021-03-28 ENCOUNTER — Encounter: Payer: Self-pay | Admitting: Legal Medicine

## 2021-03-28 ENCOUNTER — Other Ambulatory Visit: Payer: Self-pay

## 2021-03-28 ENCOUNTER — Ambulatory Visit (INDEPENDENT_AMBULATORY_CARE_PROVIDER_SITE_OTHER): Payer: Commercial Managed Care - PPO | Admitting: Legal Medicine

## 2021-03-28 VITALS — BP 140/84 | HR 67 | Temp 97.8°F | Resp 16 | Ht 71.0 in | Wt 249.0 lb

## 2021-03-28 DIAGNOSIS — Z0000 Encounter for general adult medical examination without abnormal findings: Secondary | ICD-10-CM | POA: Diagnosis not present

## 2021-03-28 DIAGNOSIS — J449 Chronic obstructive pulmonary disease, unspecified: Secondary | ICD-10-CM | POA: Diagnosis not present

## 2021-03-28 DIAGNOSIS — I10 Essential (primary) hypertension: Secondary | ICD-10-CM | POA: Diagnosis not present

## 2021-03-28 DIAGNOSIS — E782 Mixed hyperlipidemia: Secondary | ICD-10-CM

## 2021-03-28 NOTE — Patient Instructions (Signed)
Preventive Care 50-50 Years Old, Male Preventive care refers to lifestyle choices and visits with your health care provider that can promote health and wellness. This includes: A yearly physical exam. This is also called an annual wellness visit. Regular dental and eye exams. Immunizations. Screening for certain conditions. Healthy lifestyle choices, such as: Eating a healthy diet. Getting regular exercise. Not using drugs or products that contain nicotine and tobacco. Limiting alcohol use. What can I expect for my preventive care visit? Physical exam Your health care provider will check your: Height and weight. These may be used to calculate your BMI (body mass index). BMI is a measurement that tells if you are at a healthy weight. Heart rate and blood pressure. Body temperature. Skin for abnormal spots. Counseling Your health care provider may ask you questions about your: Past medical problems. Family's medical history. Alcohol, tobacco, and drug use. Emotional well-being. Home life and relationship well-being. Sexual activity. Diet, exercise, and sleep habits. Work and work environment. Access to firearms. What immunizations do I need?  Vaccines are usually given at various ages, according to a schedule. Your health care provider will recommend vaccines for you based on your age, medicalhistory, and lifestyle or other factors, such as travel or where you work. What tests do I need? Blood tests Lipid and cholesterol levels. These may be checked every 5 years, or more often if you are over 50 years old. Hepatitis C test. Hepatitis B test. Screening Lung cancer screening. You may have this screening every year starting at age 55 if you have a 30-pack-year history of smoking and currently smoke or have quit within the past 15 years. Prostate cancer screening. Recommendations will vary depending on your family history and other risks. Genital exam to check for testicular cancer  or hernias. Colorectal cancer screening. All adults should have this screening starting at age 50 and continuing until age 75. Your health care provider may recommend screening at age 45 if you are at increased risk. You will have tests every 1-10 years, depending on your results and the type of screening test. Diabetes screening. This is done by checking your blood sugar (glucose) after you have not eaten for a while (fasting). You may have this done every 1-3 years. STD (sexually transmitted disease) testing, if you are at risk. Follow these instructions at home: Eating and drinking  Eat a diet that includes fresh fruits and vegetables, whole grains, lean protein, and low-fat dairy products. Take vitamin and mineral supplements as recommended by your health care provider. Do not drink alcohol if your health care provider tells you not to drink. If you drink alcohol: Limit how much you have to 0-2 drinks a day. Be aware of how much alcohol is in your drink. In the [place] one drink equals one 12 oz bottle of beer (355 mL), one 5 oz glass of wine (148 mL), or one 1 oz glass of hard liquor (44 mL).  Lifestyle Take daily care of your teeth and gums. Brush your teeth every morning and night with fluoride toothpaste. Floss one time each day. Stay active. Exercise for at least 30 minutes 5 or more days each week. Do not use any products that contain nicotine or tobacco, such as cigarettes, e-cigarettes, and chewing tobacco. If you need help quitting, ask your health care provider. Do not use drugs. If you are sexually active, practice safe sex. Use a condom or other form of protection to prevent STIs (sexually transmitted infections). If told by   your health care provider, take low-dose aspirin daily starting at age 61. Find healthy ways to cope with stress, such as: Meditation, yoga, or listening to music. Journaling. Talking to a trusted person. Spending time with friends and  family. Safety Always wear your seat belt while driving or riding in a vehicle. Do not drive: If you have been drinking alcohol. Do not ride with someone who has been drinking. When you are tired or distracted. While texting. Wear a helmet and other protective equipment during sports activities. If you have firearms in your house, make sure you follow all gun safety procedures. What's next? Go to your health care provider once a year for an annual wellness visit. Ask your health care provider how often you should have your eyes and teeth checked. Stay up to date on all vaccines. This information is not intended to replace advice given to you by your health care provider. Make sure you discuss any questions you have with your healthcare provider. Document Revised: 04/28/2019 Document Reviewed: 07/24/2018 Elsevier Patient Education  [place]. ??????? ???????? ?????? ?? ??? 40-64 ????? Preventive Care 90-50 Years Old, Male

## 2021-03-28 NOTE — Progress Notes (Signed)
Established Patient Office Visit  Subjective:  Patient ID: Timothy Small, male    DOB: 10/09/1970  Age: 50 y.o. MRN: [id number]  CC:  Chief Complaint  Patient presents with   Annual Exam    Patient brought a form from work to complete for the physical capabilities.    HPI Timothy Small presents for CPE.  Bp was high with screening  and Dr. Harriet Masson increased hydralazine to TID. He is getting new job and doing well  Patient presents for follow up of hypertension.  Patient tolerating carvidilol, hydralazine well with side effects.  Patient was diagnosed with hypertension 2010 so has been treated for hypertension for 10 years.Patient is working on maintaining diet and exercise regimen and follows up as directed. Complication include none  Patient presents with hyperlipidemia.  Compliance with treatment has been good; patient takes medicines as directed, maintains low cholesterol diet, follows up as directed, and maintains exercise regimen.  Patient is using atorvatatin without problems. .   Past Medical History:  Diagnosis Date   COPD (chronic obstructive pulmonary disease) ([place])    ED (erectile dysfunction)    Hypertension    OSA (obstructive sleep apnea)    s/p  osa surgery 2011-- sleep study repeated after surgery per pt stated mild osa no cpap   Renal cyst 2010   multiple   Right ureteral stone     Past Surgical History:  Procedure Laterality Date   CYSTOSCOPY/RETROGRADE/URETEROSCOPY/STONE EXTRACTION WITH BASKET Right 12/23/2015   Procedure: CYSTOSCOPY/RIGHT RETROGRADE/ PYELOGRAM URETEROSCOPY/STONE EXTRACTION WITH BASKET;  Surgeon: Kathie Rhodes, MD;  Location: [facility];  Service: Urology;  Laterality: Right;   UVULOPALATOPHARYNGOPLASTY  2011   and Tonsillectomy/ Adenoidectomy for sleep apnea    Family History  Problem Relation Age of Onset   Cancer Father        bladder Ca    Heart disease Maternal Aunt 69       heart failure   Heart disease  Maternal Uncle    Heart disease Maternal Grandmother    Heart disease Maternal Grandfather    Hypertension Brother     Social History   Socioeconomic History   Marital status: Married    Spouse name: [name]   Number of children: 2   Years of education: Not on file   Highest education level: Not on file  Occupational History    Employer: [employer]  Tobacco Use   Smoking status: Never   Smokeless tobacco: Current    Types: Snuff   Tobacco comments:    1 can every two days  Vaping Use   Vaping Use: Never used  Substance and Sexual Activity   Alcohol use: Yes    Alcohol/week: 4.0 standard drinks    Types: 4 Cans of beer per week    Comment: socially   Drug use: No   Sexual activity: Yes    Partners: Female  Other Topics Concern   Not on file  Social History Narrative   Not on file   Social Determinants of Health   Financial Resource Strain: Not on file  Food Insecurity: Not on file  Transportation Needs: Not on file  Physical Activity: Not on file  Stress: Not on file  Social Connections: Not on file  Intimate Partner Violence: Not on file    Outpatient Medications Prior to Visit  Medication Sig Dispense Refill   atorvastatin (LIPITOR) 40 MG tablet TAKE 1 TABLET BY MOUTH EVERY DAY 90 tablet 2   carvedilol (  COREG) 25 MG tablet Take 1 tablet (25 mg total) by mouth 2 (two) times daily. (Patient taking differently: Take 25 mg by mouth 3 (three) times daily.) 180 tablet 3   hydrALAZINE (APRESOLINE) 25 MG tablet Take 0.5 tablets (12.5 mg total) by mouth 3 (three) times daily. (Patient taking differently: Take 25 mg by mouth 3 (three) times daily.) 180 tablet 2   hydrochlorothiazide (HYDRODIURIL) 25 MG tablet TAKE 1 TABLET BY MOUTH EVERY DAY 90 tablet 2   KLOR-CON M20 20 MEQ tablet TAKE 1 TABLET BY MOUTH EVERY DAY 90 tablet 1   valsartan (DIOVAN) 320 MG tablet TAKE 1 TABLET BY MOUTH EVERY DAY 90 tablet 2   potassium chloride (KLOR-CON) 20 MEQ packet Take 20 mEq by mouth once.      No facility-administered medications prior to visit.    No Known Allergies  ROS Review of Systems  Constitutional:  Negative for chills, fatigue and fever.  HENT:  Negative for congestion, ear pain and sore throat.   Respiratory:  Negative for cough and shortness of breath.   Cardiovascular:  Negative for chest pain.  Gastrointestinal:  Negative for abdominal pain, constipation, diarrhea, nausea and vomiting.  Endocrine: Negative for polydipsia, polyphagia and polyuria.  Genitourinary:  Negative for dysuria and frequency.  Musculoskeletal:  Negative for arthralgias and myalgias.  Neurological:  Negative for dizziness and headaches.  Psychiatric/Behavioral:  Negative for dysphoric mood.        No dysphoria     Objective:    Physical Exam Vitals reviewed.  Constitutional:      General: He is in acute distress.     Appearance: Normal appearance. He is obese.  HENT:     Right Ear: Tympanic membrane, ear canal and external ear normal.     Left Ear: Tympanic membrane, ear canal and external ear normal.     Mouth/Throat:     Mouth: Mucous membranes are moist.     Pharynx: Oropharynx is clear.  Eyes:     Extraocular Movements: Extraocular movements intact.     Conjunctiva/sclera: Conjunctivae normal.     Pupils: Pupils are equal, round, and reactive to light.  Cardiovascular:     Rate and Rhythm: Normal rate and regular rhythm.     Pulses: Normal pulses.     Heart sounds: Normal heart sounds. No murmur heard.   No gallop.  Pulmonary:     Effort: Pulmonary effort is normal. No respiratory distress.     Breath sounds: Normal breath sounds. No wheezing.  Abdominal:     General: Abdomen is flat. Bowel sounds are normal. There is no distension.     Palpations: Abdomen is soft.     Tenderness: There is no abdominal tenderness.  Musculoskeletal:        General: Normal range of motion.     Cervical back: Normal range of motion.     Right lower leg: No edema.     Left lower  leg: No edema.  Skin:    General: Skin is warm.     Capillary Refill: Capillary refill takes less than 2 seconds.  Neurological:     General: No focal deficit present.     Mental Status: He is alert and oriented to person, place, and time. Mental status is at baseline.  Psychiatric:        Mood and Affect: Mood normal.        Behavior: Behavior normal.    BP 140/84   Pulse 67   Temp  97.8 F (36.6 C)   Resp 16   Ht 5' 11"  (1.803 m)   Wt 249 lb (112.9 kg)   SpO2 96%   BMI 34.73 kg/m  Wt Readings from Last 3 Encounters:  03/28/21 249 lb (112.9 kg)  03/20/21 249 lb 12.8 oz (113.3 kg)  12/19/20 253 lb 3.2 oz (114.9 kg)     Health Maintenance Due  Topic Date Due   COVID-19 Vaccine (1) Never done   Pneumococcal Vaccine 64-61 Years old (1 - PCV) Never done   HIV Screening  Never done   Zoster Vaccines- Shingrix (1 of 2) Never done   COLONOSCOPY (Pts 45-80yr Insurance coverage will need to be confirmed)  Never done   TETANUS/TDAP  09/23/2018   INFLUENZA VACCINE  03/13/2021    There are no preventive care reminders to display for this patient.  Lab Results  Component Value Date   TSH 1.47 09/12/2016   Lab Results  Component Value Date   WBC 5.2 03/20/2021   HGB 16.4 03/20/2021   HCT 47.3 03/20/2021   MCV 88 03/20/2021   PLT 192 03/20/2021   Lab Results  Component Value Date   NA 141 03/20/2021   K 3.8 03/20/2021   CO2 24 03/20/2021   GLUCOSE 105 (H) 03/20/2021   BUN 12 03/20/2021   CREATININE 0.93 03/20/2021   BILITOT 0.5 03/20/2021   ALKPHOS 71 03/20/2021   AST 15 03/20/2021   ALT 30 03/20/2021   PROT 6.5 03/20/2021   ALBUMIN 4.5 03/20/2021   CALCIUM 9.0 03/20/2021   EGFR 100 03/20/2021   GFR 78.39 09/12/2016   Lab Results  Component Value Date   CHOL 146 03/20/2021   Lab Results  Component Value Date   HDL 35 (L) 03/20/2021   Lab Results  Component Value Date   LDLCALC 80 03/20/2021   Lab Results  Component Value Date   TRIG 181 (H)  03/20/2021   Lab Results  Component Value Date   CHOLHDL 4.2 03/20/2021   Lab Results  Component Value Date   HGBA1C 5.4 11/17/2020      Assessment & Plan:   Diagnoses and all orders for this visit: Essential hypertension An individual hypertension care plan was established and reinforced today.  The patient's status was assessed using clinical findings on exam and labs or diagnostic tests. The patient's success at meeting treatment goals on disease specific evidence-based guidelines and found to be improving controlled. SELF MANAGEMENT: The patient and I together assessed ways to personally work towards obtaining the recommended goals. RECOMMENDATIONS: avoid decongestants found in common cold remedies, decrease consumption of alcohol, perform routine monitoring of BP with home BP cuff, exercise, reduction of dietary salt, take medicines as prescribed, try not to miss doses and quit smoking.  Regular exercise and maintaining a healthy weight is needed.  Stress reduction may help. A CLINICAL SUMMARY including written plan identify barriers to care unique to individual due to social or financial issues.  We attempt to mutually creat solutions for individual and family understanding.   Chronic obstructive pulmonary disease, unspecified COPD type ([place] An individualize plan was formulated for care of COPD.  Treatment is evidence based.  She will continue on inhalers, avoid smoking and smoke.  Regular exercise with help with dyspnea. Routine follow ups and medication compliance is needed.   Mixed hyperlipidemia AN INDIVIDUAL CARE PLAN for hyperlipidemia/ cholesterol was established and reinforced today.  The patient's status was assessed using clinical findings on exam, lab and other  diagnostic tests. The patient's disease status was assessed based on evidence-based guidelines and found to be fair controlled. MEDICATIONS were reviewed. SELF MANAGEMENT GOALS have been discussed and patient's  success at attaining the goal of low cholesterol was assessed. RECOMMENDATION given include regular exercise 3 days a week and low cholesterol/low fat diet. CLINICAL SUMMARY including written plan to identify barriers unique to the patient due to social or economic  reasons was discussed.   Routine general medical examination at a health care facility  Work physical performed and forms completed    Follow-up: Return if symptoms worsen or fail to improve.    Reinaldo Meeker, MD

## 2021-05-05 DIAGNOSIS — I10 Essential (primary) hypertension: Secondary | ICD-10-CM | POA: Insufficient documentation

## 2021-05-05 DIAGNOSIS — G4733 Obstructive sleep apnea (adult) (pediatric): Secondary | ICD-10-CM | POA: Insufficient documentation

## 2021-05-09 ENCOUNTER — Ambulatory Visit: Payer: Commercial Managed Care - PPO | Admitting: Cardiology

## 2021-05-10 ENCOUNTER — Telehealth: Payer: Self-pay

## 2021-05-10 NOTE — Telephone Encounter (Signed)
No return call by 5:00 today to r/s PV.  NS letter sent, procedure cancelled.

## 2021-05-10 NOTE — Telephone Encounter (Signed)
Attempted x3  to reach pt for PV but anable.  LVMM for pt to call back by 5:00 PM to day to avoid cancellation of procedure.

## 2021-05-24 ENCOUNTER — Encounter: Payer: Commercial Managed Care - PPO | Admitting: Gastroenterology

## 2021-09-10 ENCOUNTER — Other Ambulatory Visit: Payer: Self-pay | Admitting: Legal Medicine

## 2021-09-10 DIAGNOSIS — E876 Hypokalemia: Secondary | ICD-10-CM

## 2021-09-25 ENCOUNTER — Ambulatory Visit: Payer: Commercial Managed Care - PPO | Admitting: Legal Medicine

## 2021-09-27 ENCOUNTER — Encounter: Payer: Self-pay | Admitting: Legal Medicine

## 2021-09-27 ENCOUNTER — Ambulatory Visit: Payer: BC Managed Care – PPO | Admitting: Legal Medicine

## 2021-09-27 ENCOUNTER — Other Ambulatory Visit: Payer: Self-pay

## 2021-09-27 VITALS — BP 138/84 | HR 101 | Temp 98.1°F | Resp 15 | Ht 71.0 in | Wt 246.0 lb

## 2021-09-27 DIAGNOSIS — J449 Chronic obstructive pulmonary disease, unspecified: Secondary | ICD-10-CM

## 2021-09-27 DIAGNOSIS — I471 Supraventricular tachycardia: Secondary | ICD-10-CM | POA: Insufficient documentation

## 2021-09-27 DIAGNOSIS — I10 Essential (primary) hypertension: Secondary | ICD-10-CM | POA: Diagnosis not present

## 2021-09-27 DIAGNOSIS — E782 Mixed hyperlipidemia: Secondary | ICD-10-CM

## 2021-09-27 DIAGNOSIS — E8881 Metabolic syndrome: Secondary | ICD-10-CM

## 2021-09-27 DIAGNOSIS — G4733 Obstructive sleep apnea (adult) (pediatric): Secondary | ICD-10-CM | POA: Diagnosis not present

## 2021-09-27 NOTE — Progress Notes (Signed)
Subjective:  Patient ID: Timothy Small, male    DOB: 03/17/1971  Age: 51 y.o. MRN: [id number]  Chief Complaint  Patient presents with   Hypertension   Hyperlipidemia   Cough    HPI Hyperlipidemia: Patient is taking atorvastatin 40 mg daily. Patient presents with hyperlipidemia.  Compliance with treatment has been good; patient takes medicines as directed, maintains low cholesterol diet, follows up as directed, and maintains exercise regimen.  Patient is using atorvastatin without problems.   Hypertension: He is taking amlodipine 10 daily, hydrochlorothiazide 25 mg daily, valsartan 320 mg daily.  COPD stable.  PAT- resolved  OSA- had surgery and doing fine. Current Outpatient Medications on File Prior to Visit  Medication Sig Dispense Refill   amLODipine (NORVASC) 10 MG tablet Take 10 mg by mouth daily.     atorvastatin (LIPITOR) 40 MG tablet TAKE 1 TABLET BY MOUTH EVERY DAY 90 tablet 2   hydrochlorothiazide (HYDRODIURIL) 25 MG tablet TAKE 1 TABLET BY MOUTH EVERY DAY 90 tablet 2   KLOR-CON M20 20 MEQ tablet TAKE 1 TABLET BY MOUTH EVERY DAY 90 tablet 2   valsartan (DIOVAN) 320 MG tablet TAKE 1 TABLET BY MOUTH EVERY DAY 90 tablet 2   No current facility-administered medications on file prior to visit.   Past Medical History:  Diagnosis Date   BMI 34.0-34.9,adult 11/17/2020   BMI 35.0-35.9,adult    BRONCHIECTASIS W/O ACUTE EXACERBATION 09/14/2009   Qualifier: Diagnosis of  By: Melvyn Novas MD, Christena Deem    Chest pain 11/17/2020   Chronic obstructive pulmonary disease, unspecified COPD type ([place]) 09/19/2020   Dizziness 11/17/2020   ED (erectile dysfunction)    Erectile dysfunction 08/14/2015   Essential hypertension 09/14/2009   Qualifier: Diagnosis of  By: Tilden Dome     HLD (hyperlipidemia) 07/21/2015   Hypertension    Metabolic syndrome 94/03/5461   Multiple atypical nevi 09/13/2016   Nephrolithiasis 12/16/2015   OSA (obstructive sleep apnea)    s/p  osa surgery 2011-- sleep study  repeated after surgery per pt stated mild osa no cpap   Palpitations 11/17/2020   Renal cyst 08/13/2008   multiple   Right ureteral stone    Routine general medical examination at a health care facility 09/13/2016   Shingles 11/01/2020   Shortness of breath 11/17/2020   Syncope 11/01/2020   Syncope and collapse 11/17/2020   Past Surgical History:  Procedure Laterality Date   CYSTOSCOPY/RETROGRADE/URETEROSCOPY/STONE EXTRACTION WITH BASKET Right 12/23/2015   Procedure: CYSTOSCOPY/RIGHT RETROGRADE/ PYELOGRAM URETEROSCOPY/STONE EXTRACTION WITH BASKET;  Surgeon: Kathie Rhodes, MD;  Location: [facility];  Service: Urology;  Laterality: Right;   UVULOPALATOPHARYNGOPLASTY  2011   and Tonsillectomy/ Adenoidectomy for sleep apnea    Family History  Problem Relation Age of Onset   Cancer Father        bladder Ca    Heart disease Maternal Aunt 36       heart failure   Heart disease Maternal Uncle    Heart disease Maternal Grandmother    Heart disease Maternal Grandfather    Hypertension Brother    Social History   Socioeconomic History   Marital status: Married    Spouse name: [name]   Number of children: 2   Years of education: Not on file   Highest education level: Not on file  Occupational History    Employer: [employer]  Tobacco Use   Smoking status: Never   Smokeless tobacco: Current    Types: Snuff   Tobacco comments:  1 can every two days  Vaping Use   Vaping Use: Never used  Substance and Sexual Activity   Alcohol use: Yes    Alcohol/week: 4.0 standard drinks    Types: 4 Cans of beer per week    Comment: socially   Drug use: No   Sexual activity: Yes    Partners: Female  Other Topics Concern   Not on file  Social History Narrative   Not on file   Social Determinants of Health   Financial Resource Strain: Not on file  Food Insecurity: Not on file  Transportation Needs: Not on file  Physical Activity: Not on file  Stress: Not on file  Social Connections:  Not on file    Review of Systems  Constitutional:  Negative for chills, fatigue, fever and unexpected weight change.  HENT:  Negative for congestion, ear pain, sinus pain and sore throat.   Respiratory:  Positive for cough. Negative for shortness of breath.   Cardiovascular:  Negative for chest pain and palpitations.  Gastrointestinal:  Negative for abdominal pain, blood in stool, constipation, diarrhea, nausea and vomiting.  Endocrine: Negative for polydipsia.  Genitourinary:  Negative for dysuria.  Musculoskeletal:  Negative for back pain.  Skin:  Negative for rash.  Neurological:  Negative for headaches.  Psychiatric/Behavioral: Negative.      Objective:  BP 138/84    Pulse (!) 101    Temp 98.1 F (36.7 C)    Resp 15    Ht 5\' 11"  (1.803 m)    Wt 246 lb (111.6 kg)    SpO2 96%    BMI 34.31 kg/m   BP/Weight 09/27/2021 2/77/8242 10/15/3612  Systolic BP 431 540 086  Diastolic BP 84 84 80  Wt. (Lbs) 246 249 249.8  BMI 34.31 34.73 34.84    Physical Exam Vitals reviewed.  Constitutional:      General: He is not in acute distress.    Appearance: Normal appearance. He is obese.  HENT:     Head: Normocephalic and atraumatic.     Right Ear: Tympanic membrane normal.     Left Ear: Tympanic membrane normal.  Eyes:     Extraocular Movements: Extraocular movements intact.     Conjunctiva/sclera: Conjunctivae normal.     Pupils: Pupils are equal, round, and reactive to light.  Cardiovascular:     Rate and Rhythm: Normal rate. Rhythm irregular.     Pulses: Normal pulses.     Heart sounds: Normal heart sounds. No murmur heard.   No gallop.  Pulmonary:     Effort: Pulmonary effort is normal. No respiratory distress.     Breath sounds: Normal breath sounds. No wheezing.  Abdominal:     General: Abdomen is flat. Bowel sounds are normal. There is no distension.     Palpations: Abdomen is soft.     Tenderness: There is no abdominal tenderness.  Musculoskeletal:        General: Normal  range of motion.     Cervical back: Normal range of motion and neck supple.     Right lower leg: No edema.     Left lower leg: No edema.  Skin:    General: Skin is warm.     Capillary Refill: Capillary refill takes less than 2 seconds.  Neurological:     General: No focal deficit present.     Mental Status: He is alert and oriented to person, place, and time. Mental status is at baseline.     Gait: Gait  normal.     Deep Tendon Reflexes: Reflexes normal.  Psychiatric:        Mood and Affect: Mood normal.        Thought Content: Thought content normal.        Judgment: Judgment normal.        Lab Results  Component Value Date   WBC 5.2 03/20/2021   HGB 16.4 03/20/2021   HCT 47.3 03/20/2021   PLT 192 03/20/2021   GLUCOSE 105 (H) 03/20/2021   CHOL 146 03/20/2021   TRIG 181 (H) 03/20/2021   HDL 35 (L) 03/20/2021   LDLDIRECT 77.0 09/12/2016   LDLCALC 80 03/20/2021   ALT 30 03/20/2021   AST 15 03/20/2021   NA 141 03/20/2021   K 3.8 03/20/2021   CL 103 03/20/2021   CREATININE 0.93 03/20/2021   BUN 12 03/20/2021   CO2 24 03/20/2021   TSH 1.47 09/12/2016   HGBA1C 5.4 11/17/2020      Assessment & Plan:   Problem List Items Addressed This Visit       Cardiovascular and Mediastinum   Essential hypertension - Primary   Relevant Medications   amLODipine (NORVASC) 10 MG tablet   Other Relevant Orders   Comprehensive metabolic panel   CBC with Differential/Platelet An individual hypertension care plan was established and reinforced today.  The patient's status was assessed using clinical findings on exam and labs or diagnostic tests. The patient's success at meeting treatment goals on disease specific evidence-based guidelines and found to be well controlled. SELF MANAGEMENT: The patient and I together assessed ways to personally work towards obtaining the recommended goals. RECOMMENDATIONS: avoid decongestants found in common cold remedies, decrease consumption of  alcohol, perform routine monitoring of BP with home BP cuff, exercise, reduction of dietary salt, take medicines as prescribed, try not to miss doses and quit smoking.  Regular exercise and maintaining a healthy weight is needed.  Stress reduction may help. A CLINICAL SUMMARY including written plan identify barriers to care unique to individual due to social or financial issues.  We attempt to mutually creat solutions for individual and family understanding.     PAT (paroxysmal atrial tachycardia) (HCC)   Relevant Medications   amLODipine (NORVASC) 10 MG tablet No recurrence of PAT     Respiratory   Chronic obstructive pulmonary disease, unspecified COPD type ([place]) An individualize plan was formulated for care of COPD.  Treatment is evidence based.  She will continue on inhalers, avoid smoking and smoke.  Regular exercise with help with dyspnea. Routine follow ups and medication compliance is needed.     OSA (obstructive sleep apnea) Doing well had oral surgery for OSA      Other   Metabolic syndrome   Relevant Orders   Hemoglobin A1c Patient has metabolic syndrome at risk for DM    HLD (hyperlipidemia)   Relevant Medications   amLODipine (NORVASC) 10 MG tablet   Other Relevant Orders   Lipid panel AN INDIVIDUAL CARE PLAN for hyperlipidemia/ cholesterol was established and reinforced today.  The patient's status was assessed using clinical findings on exam, lab and other diagnostic tests. The patient's disease status was assessed based on evidence-based guidelines and found to be fair controlled. MEDICATIONS were reviewed. SELF MANAGEMENT GOALS have been discussed and patient's success at attaining the goal of low cholesterol was assessed. RECOMMENDATION given include regular exercise 3 days a week and low cholesterol/low fat diet. CLINICAL SUMMARY including written plan to identify barriers unique to the patient  due to social or economic  reasons was discussed.          Follow-up: Return in about 6 months (around 03/27/2022) for fasting.  An After Visit Summary was printed and given to the patient.  Reinaldo Meeker, MD [facility] [phone]

## 2021-09-28 LAB — COMPREHENSIVE METABOLIC PANEL
ALT: 31 IU/L (ref 0–44)
AST: 16 IU/L (ref 0–40)
Albumin/Globulin Ratio: 2.3 — ABNORMAL HIGH (ref 1.2–2.2)
Albumin: 5 g/dL (ref 4.0–5.0)
Alkaline Phosphatase: 77 IU/L (ref 44–121)
BUN/Creatinine Ratio: 17 (ref 9–20)
BUN: 18 mg/dL (ref 6–24)
Bilirubin Total: 0.7 mg/dL (ref 0.0–1.2)
CO2: 26 mmol/L (ref 20–29)
Calcium: 9.8 mg/dL (ref 8.7–10.2)
Chloride: 99 mmol/L (ref 96–106)
Creatinine, Ser: 1.09 mg/dL (ref 0.76–1.27)
Globulin, Total: 2.2 g/dL (ref 1.5–4.5)
Glucose: 98 mg/dL (ref 70–99)
Potassium: 4.3 mmol/L (ref 3.5–5.2)
Sodium: 141 mmol/L (ref 134–144)
Total Protein: 7.2 g/dL (ref 6.0–8.5)
eGFR: 83 mL/min/{1.73_m2} (ref >59)

## 2021-09-28 LAB — CBC WITH DIFFERENTIAL/PLATELET
Basophils Absolute: 0.1 10*3/uL (ref 0.0–0.2)
Basos: 1 %
EOS (ABSOLUTE): 0.1 10*3/uL (ref 0.0–0.4)
Eos: 1 %
Hematocrit: 51.7 % — ABNORMAL HIGH (ref 37.5–51.0)
Hemoglobin: 17.8 g/dL — ABNORMAL HIGH (ref 13.0–17.7)
Immature Grans (Abs): 0 10*3/uL (ref 0.0–0.1)
Immature Granulocytes: 0 %
Lymphocytes Absolute: 1.1 10*3/uL (ref 0.7–3.1)
Lymphs: 19 %
MCH: 29.7 pg (ref 26.6–33.0)
MCHC: 34.4 g/dL (ref 31.5–35.7)
MCV: 86 fL (ref 79–97)
Monocytes Absolute: 0.5 10*3/uL (ref 0.1–0.9)
Monocytes: 9 %
Neutrophils Absolute: 3.9 10*3/uL (ref 1.4–7.0)
Neutrophils: 70 %
Platelets: 212 10*3/uL (ref 150–450)
RBC: 5.99 x10E6/uL — ABNORMAL HIGH (ref 4.14–5.80)
RDW: 12.6 % (ref 11.6–15.4)
WBC: 5.6 10*3/uL (ref 3.4–10.8)

## 2021-09-28 LAB — LIPID PANEL
Chol/HDL Ratio: 3.1 ratio (ref 0.0–5.0)
Cholesterol, Total: 137 mg/dL (ref 100–199)
HDL: 44 mg/dL (ref >39)
LDL Chol Calc (NIH): 71 mg/dL (ref 0–99)
Triglycerides: 125 mg/dL (ref 0–149)
VLDL Cholesterol Cal: 22 mg/dL (ref 5–40)

## 2021-09-28 LAB — CARDIOVASCULAR RISK ASSESSMENT

## 2021-09-28 LAB — HEMOGLOBIN A1C
Est. average glucose Bld gHb Est-mCnc: 108 mg/dL
Hgb A1c MFr Bld: 5.4 % (ref 4.8–5.6)

## 2021-09-28 LAB — TSH: TSH: 2.24 u[IU]/mL (ref 0.450–4.500)

## 2021-09-28 NOTE — Progress Notes (Signed)
Kidney and liver tests normal, rbc high recommend recheck one week, A1c 5.4, cholesterol normal, TSH 2.24 normal lp

## 2021-10-05 ENCOUNTER — Other Ambulatory Visit: Payer: Self-pay

## 2021-10-05 ENCOUNTER — Other Ambulatory Visit: Payer: BC Managed Care – PPO

## 2021-10-05 DIAGNOSIS — I10 Essential (primary) hypertension: Secondary | ICD-10-CM

## 2021-10-05 LAB — CBC WITH DIFFERENTIAL/PLATELET
Basophils Absolute: 0 10*3/uL (ref 0.0–0.2)
Basos: 1 %
EOS (ABSOLUTE): 0.1 10*3/uL (ref 0.0–0.4)
Eos: 2 %
Hematocrit: 48.7 % (ref 37.5–51.0)
Hemoglobin: 16.6 g/dL (ref 13.0–17.7)
Immature Grans (Abs): 0 10*3/uL (ref 0.0–0.1)
Immature Granulocytes: 0 %
Lymphocytes Absolute: 1.1 10*3/uL (ref 0.7–3.1)
Lymphs: 22 %
MCH: 30.5 pg (ref 26.6–33.0)
MCHC: 34.1 g/dL (ref 31.5–35.7)
MCV: 89 fL (ref 79–97)
Monocytes Absolute: 0.5 10*3/uL (ref 0.1–0.9)
Monocytes: 10 %
Neutrophils Absolute: 3.1 10*3/uL (ref 1.4–7.0)
Neutrophils: 65 %
Platelets: 183 10*3/uL (ref 150–450)
RBC: 5.45 x10E6/uL (ref 4.14–5.80)
RDW: 12.9 % (ref 11.6–15.4)
WBC: 4.8 10*3/uL (ref 3.4–10.8)

## 2021-10-06 NOTE — Progress Notes (Signed)
Cbc normal yesterday lp

## 2021-10-21 ENCOUNTER — Other Ambulatory Visit: Payer: Self-pay | Admitting: Legal Medicine

## 2021-10-21 DIAGNOSIS — E782 Mixed hyperlipidemia: Secondary | ICD-10-CM

## 2021-10-21 DIAGNOSIS — I10 Essential (primary) hypertension: Secondary | ICD-10-CM

## 2021-11-12 IMAGING — CT CT HEART MORP W/ CTA COR W/ SCORE W/ CA W/CM &/OR W/O CM
4 of 7 series · 8 of 20 positions shown, 9 images · IV contrast (APPLIED)
Comparison: None.

Addendum:
CLINICAL DATA: Chest pain

EXAM:
Cardiac CTA
MEDICATIONS:
Sub lingual nitro. 4mg and coreg 6.25 orally
TECHNIQUE: The patient was scanned on a Siemens Force [REDACTED]ice scanner. Gantry
rotation speed was 250 msecs. Collimation was .6 mm. A 100 kV
prospective scan was triggered in the ascending thoracic aorta at
140 HU's Full mA was used between 35% and 75% of the R-R interval.
Average HR during the scan was 69 bpm. The 3D data set was
interpreted on a dedicated work station using MPR, MIP and VRT
modes. A total of 80 cc of contrast was used.

[Series 6: best diast 68 % · axial · 0.44mm/px · z∈[+1219,+1262]mm · 2 of 328 slices shown]
[im 110/328  vessel]
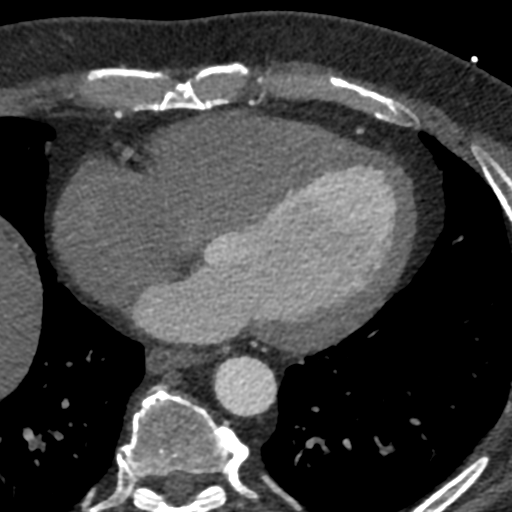
[im 219/328  vessel]
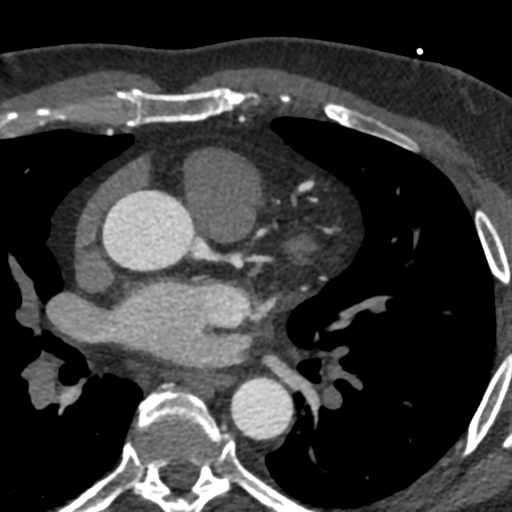

[Series 8: best syst · axial · 0.44mm/px · z∈[+1219,+1262]mm · 2 of 328 slices shown, 3 images]
[im 110/328  vessel]
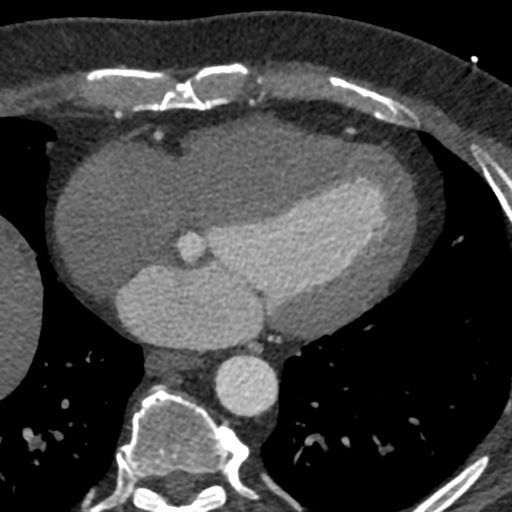
[im 110/328  lung]
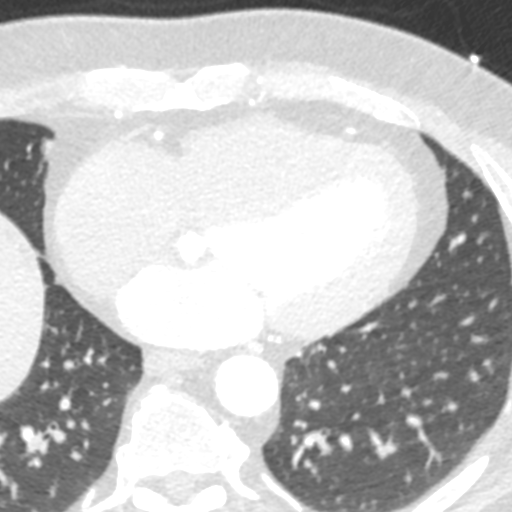
[im 219/328  vessel]
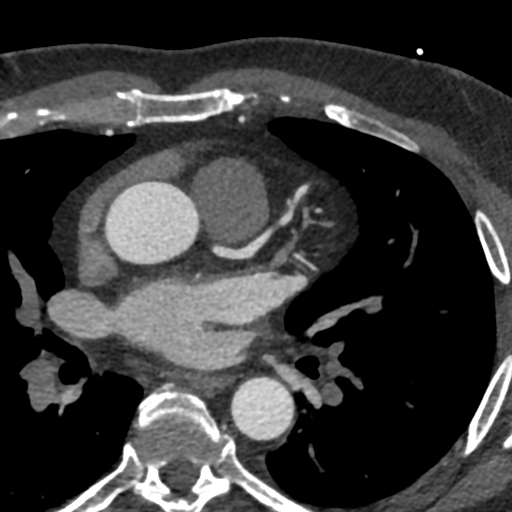

[Series 10: ts diast sharp · axial · 0.44mm/px · z∈[+1219,+1262]mm · 2 of 328 slices shown]
[im 110/328  lung]
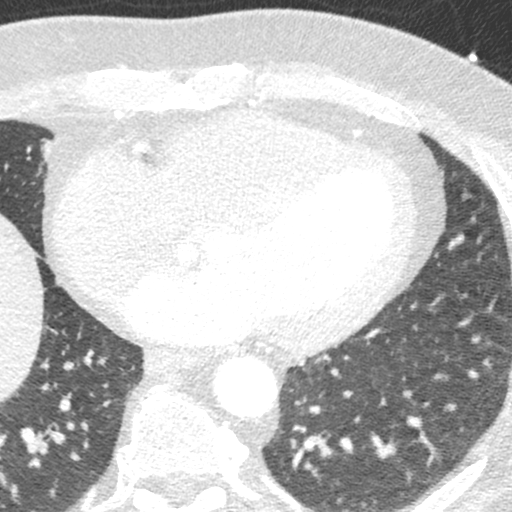
[im 219/328  lung]
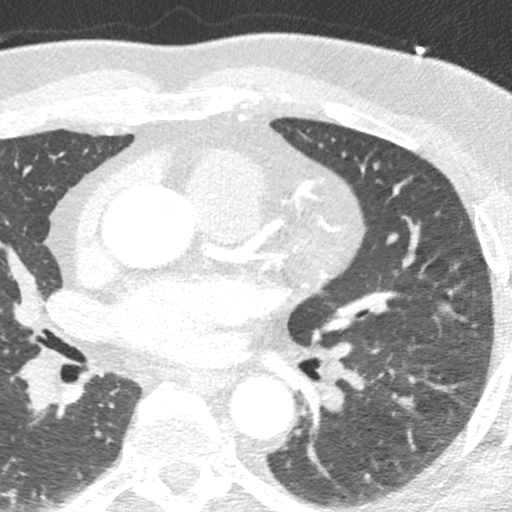

[Series 11: ts syst sharp · axial · 0.44mm/px · z∈[+1219,+1262]mm · 2 of 328 slices shown]
[im 110/328  lung]
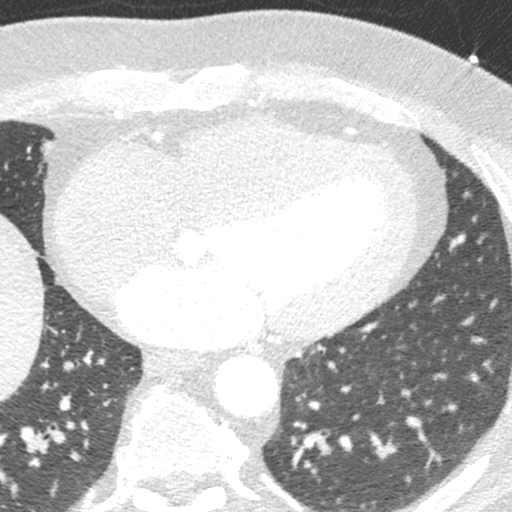
[im 219/328  lung]
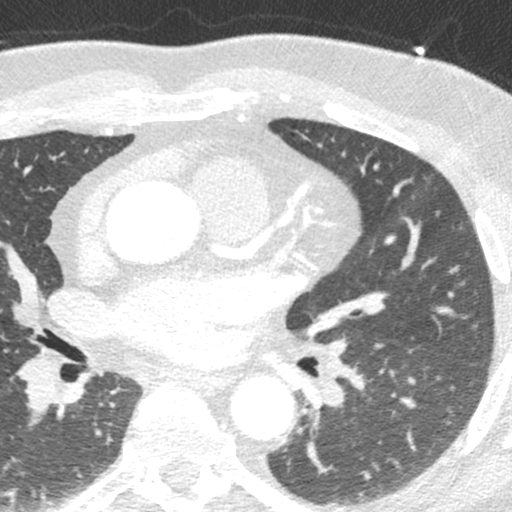

[8 of 20 positions shown; findings below may reference images not displayed]

FINDINGS: Non-cardiac: See separate report from [REDACTED]. No
significant findings on limited lung and soft tissue windows.

Calcium Score: No calcium noted

Coronary Arteries: Right dominant with no anomalies

LM: Normal

LAD: 1-24% proximal LAD soft plaque at D 1 take off

IM: Normal

D1: Normal

D2: Normal

D3: Normal

Circumflex: Normal

OM1: Normal

OM2: Normal

RCA: Normal

PDA: Normal

PLA: Bamyan
IMPRESSION: 1. Calcium score 0

2.  CAD RADS 1 1-24% soft plaque in proximal LAD at D1 take off

3.  Normal aortic root diameter 3.7 cm

Arshin Riflo

EXAM:
OVER-READ INTERPRETATION  CT CHEST

The following report is an over-read performed by radiologist Dr.
over-read does not include interpretation of cardiac or coronary
anatomy or pathology. The cardiac CTA interpretation by the
cardiologist is attached.
FINDINGS: Limited view of the lung parenchyma demonstrates no suspicious
nodularity. Airways are normal.

Limited view of the mediastinum demonstrates no adenopathy.
Esophagus normal.

Limited view of the upper abdomen unremarkable.

Limited view of the skeleton and chest wall is unremarkable.
IMPRESSION: No significant extracardiac findings.

*** End of Addendum ***
FINDINGS: Non-cardiac: See separate report from [REDACTED]. No
significant findings on limited lung and soft tissue windows.

Calcium Score: No calcium noted

Coronary Arteries: Right dominant with no anomalies

LM: Normal

LAD: 1-24% proximal LAD soft plaque at D 1 take off

IM: Normal

D1: Normal

D2: Normal

D3: Normal

Circumflex: Normal

OM1: Normal

OM2: Normal

RCA: Normal

PDA: Normal

PLA: Bamyan
IMPRESSION: 1. Calcium score 0

2.  CAD RADS 1 1-24% soft plaque in proximal LAD at D1 take off

3.  Normal aortic root diameter 3.7 cm

Arshin Riflo

## 2021-11-18 ENCOUNTER — Other Ambulatory Visit: Payer: Self-pay | Admitting: Legal Medicine

## 2022-01-26 ENCOUNTER — Other Ambulatory Visit: Payer: Self-pay

## 2022-01-26 MED ORDER — SILDENAFIL CITRATE 20 MG PO TABS
20.0000 mg | ORAL_TABLET | Freq: Three times a day (TID) | ORAL | 1 refills | Status: DC
Start: 2022-01-26 — End: 2022-02-28

## 2022-02-16 ENCOUNTER — Other Ambulatory Visit: Payer: Self-pay | Admitting: Legal Medicine

## 2022-02-16 DIAGNOSIS — I10 Essential (primary) hypertension: Secondary | ICD-10-CM

## 2022-02-28 ENCOUNTER — Other Ambulatory Visit: Payer: Self-pay

## 2022-02-28 MED ORDER — SILDENAFIL CITRATE 20 MG PO TABS: ORAL_TABLET | ORAL | 1 refills | Status: DC

## 2022-03-27 NOTE — Progress Notes (Signed)
Subjective:  Patient ID: Timothy Small, male    DOB: 03-09-1971  Age: 51 y.o. MRN: [id number]  Chief Complaint  Patient presents with   Hyperlipidemia   Hypertension    HPI: chronic  Patient presents for follow up of hypertension.  Patient Amlodipine 10 mg daily, Hctz 25 mg daily, Valsartan 320 mg daily tolerating  well without side effects.  Patient is working on maintaining diet and exercise regimen and follows up as directed.  Patient presents with hyperlipidemia.  Compliance with treatment has been good; patient takes medicines as directed, maintains low cholesterol diet, follows up as directed, and maintains exercise regimen.  Patient is using Atorvastatin 40 mg daily without problems.    Current Outpatient Medications on File Prior to Visit  Medication Sig Dispense Refill   amLODipine (NORVASC) 10 MG tablet TAKE 1 TABLET BY MOUTH EVERY DAY IN THE MORNING 90 tablet 1   atorvastatin (LIPITOR) 40 MG tablet TAKE 1 TABLET BY MOUTH EVERY DAY 90 tablet 2   hydrochlorothiazide (HYDRODIURIL) 25 MG tablet TAKE 1 TABLET BY MOUTH EVERY DAY 90 tablet 2   sildenafil (REVATIO) 20 MG tablet Take 3 tablets once daily as needed. 30 tablet 1   valsartan (DIOVAN) 320 MG tablet TAKE 1 TABLET BY MOUTH EVERY DAY 90 tablet 2   No current facility-administered medications on file prior to visit.   Past Medical History:  Diagnosis Date   BMI 34.0-34.9,adult 11/17/2020   BMI 35.0-35.9,adult    BRONCHIECTASIS W/O ACUTE EXACERBATION 09/14/2009   Qualifier: Diagnosis of  By: Melvyn Novas MD, Christena Deem    Chest pain 11/17/2020   Chronic obstructive pulmonary disease, unspecified COPD type ([place]) 09/19/2020   Dizziness 11/17/2020   ED (erectile dysfunction)    Erectile dysfunction 08/14/2015   Essential hypertension 09/14/2009   Qualifier: Diagnosis of  By: Tilden Dome     HLD (hyperlipidemia) 07/21/2015   Hypertension    Metabolic syndrome 40/08/270   Multiple atypical nevi 09/13/2016   Nephrolithiasis 12/16/2015    OSA (obstructive sleep apnea)    s/p  osa surgery 2011-- sleep study repeated after surgery per pt stated mild osa no cpap   Palpitations 11/17/2020   Renal cyst 08/13/2008   multiple   Right ureteral stone    Routine general medical examination at a health care facility 09/13/2016   Shingles 11/01/2020   Shortness of breath 11/17/2020   Syncope 11/01/2020   Syncope and collapse 11/17/2020   Past Surgical History:  Procedure Laterality Date   CYSTOSCOPY/RETROGRADE/URETEROSCOPY/STONE EXTRACTION WITH BASKET Right 12/23/2015   Procedure: CYSTOSCOPY/RIGHT RETROGRADE/ PYELOGRAM URETEROSCOPY/STONE EXTRACTION WITH BASKET;  Surgeon: Kathie Rhodes, MD;  Location: [facility];  Service: Urology;  Laterality: Right;   UVULOPALATOPHARYNGOPLASTY  2011   and Tonsillectomy/ Adenoidectomy for sleep apnea    Family History  Problem Relation Age of Onset   Cancer Father        bladder Ca    Heart disease Maternal Aunt 89       heart failure   Heart disease Maternal Uncle    Heart disease Maternal Grandmother    Heart disease Maternal Grandfather    Hypertension Brother    Social History   Socioeconomic History   Marital status: Married    Spouse name: [name]   Number of children: 2   Years of education: Not on file   Highest education level: Not on file  Occupational History    Employer: [employer]  Tobacco Use   Smoking status: Never  Smokeless tobacco: Current    Types: Snuff   Tobacco comments:    1 can every two days  Vaping Use   Vaping Use: Never used  Substance and Sexual Activity   Alcohol use: Yes    Alcohol/week: 4.0 standard drinks of alcohol    Types: 4 Cans of beer per week    Comment: socially   Drug use: No   Sexual activity: Yes    Partners: Female  Other Topics Concern   Not on file  Social History Narrative   Not on file   Social Determinants of Health   Financial Resource Strain: Not on file  Food Insecurity: Not on file  Transportation Needs: Not on  file  Physical Activity: Not on file  Stress: Not on file  Social Connections: Not on file    Review of Systems  Constitutional:  Negative for activity change and appetite change.  HENT:  Negative for congestion.   Eyes:  Negative for visual disturbance.  Respiratory:  Positive for chest tightness. Negative for shortness of breath.   Cardiovascular:  Negative for chest pain, palpitations and leg swelling.  Gastrointestinal:  Negative for abdominal distention and abdominal pain.  Endocrine: Negative for polyuria.  Genitourinary:  Negative for difficulty urinating and dysuria.  Musculoskeletal:  Negative for arthralgias and back pain.  Skin: Negative.   Neurological: Negative.   Psychiatric/Behavioral: Negative.       Objective:  BP 138/72   Pulse 63   Resp 18   Ht '5\' 11"'$  (1.803 m)   Wt 240 lb (108.9 kg)   SpO2 97%   BMI 33.47 kg/m      03/28/2022    7:31 AM 09/27/2021    8:07 AM 03/28/2021    9:16 AM  BP/Weight  Systolic BP 701 410 301  Diastolic BP 72 84 84  Wt. (Lbs) 240 246 249  BMI 33.47 kg/m2 34.31 kg/m2 34.73 kg/m2    Physical Exam Vitals reviewed.  Constitutional:      General: He is not in acute distress.    Appearance: Normal appearance. He is obese.  HENT:     Right Ear: Tympanic membrane normal.     Left Ear: Tympanic membrane normal.     Nose: Nose normal.     Mouth/Throat:     Mouth: Mucous membranes are moist.     Pharynx: Oropharynx is clear.  Eyes:     Extraocular Movements: Extraocular movements intact.     Conjunctiva/sclera: Conjunctivae normal.     Pupils: Pupils are equal, round, and reactive to light.  Cardiovascular:     Rate and Rhythm: Normal rate and regular rhythm.     Pulses: Normal pulses.     Heart sounds: Normal heart sounds. No murmur heard.    No gallop.  Pulmonary:     Effort: Pulmonary effort is normal. No respiratory distress.     Breath sounds: Normal breath sounds. No wheezing.  Abdominal:     General: Abdomen is  flat. Bowel sounds are normal. There is no distension.     Palpations: Abdomen is soft.     Tenderness: There is no abdominal tenderness.  Musculoskeletal:        General: Normal range of motion.     Cervical back: Normal range of motion and neck supple.     Right lower leg: No edema.     Left lower leg: No edema.  Skin:    General: Skin is warm and dry.  Capillary Refill: Capillary refill takes less than 2 seconds.  Neurological:     General: No focal deficit present.     Mental Status: He is alert and oriented to person, place, and time. Mental status is at baseline.     Gait: Gait normal.  Psychiatric:        Mood and Affect: Mood normal.        Thought Content: Thought content normal.        Judgment: Judgment normal.         Lab Results  Component Value Date   WBC 4.9 03/28/2022   HGB 17.1 03/28/2022   HCT 48.6 03/28/2022   PLT 186 03/28/2022   GLUCOSE 103 (H) 03/28/2022   CHOL 129 03/28/2022   TRIG 158 (H) 03/28/2022   HDL 40 03/28/2022   LDLDIRECT 77.0 09/12/2016   LDLCALC 62 03/28/2022   ALT 26 03/28/2022   AST 14 03/28/2022   NA 143 03/28/2022   K 4.0 03/28/2022   CL 101 03/28/2022   CREATININE 1.05 03/28/2022   BUN 16 03/28/2022   CO2 25 03/28/2022   TSH 2.240 09/27/2021   HGBA1C 5.3 03/28/2022      Assessment & Plan:   Problem List Items Addressed This Visit       Cardiovascular and Mediastinum   Essential hypertension - Primary   Relevant Orders   Comprehensive metabolic panel (Completed)   CBC with Differential/Platelet (Completed) An individual hypertension care plan was established and reinforced today.  The patient's status was assessed using clinical findings on exam and labs or diagnostic tests. The patient's success at meeting treatment goals on disease specific evidence-based guidelines and found to be well controlled. SELF MANAGEMENT: The patient and I together assessed ways to personally work towards obtaining the recommended  goals. RECOMMENDATIONS: avoid decongestants found in common cold remedies, decrease consumption of alcohol, perform routine monitoring of BP with home BP cuff, exercise, reduction of dietary salt, take medicines as prescribed, try not to miss doses and quit smoking.  Regular exercise and maintaining a healthy weight is needed.  Stress reduction may help. A CLINICAL SUMMARY including written plan identify barriers to care unique to individual due to social or financial issues.  We attempt to mutually creat solutions for individual and family understanding.    PAT (paroxysmal atrial tachycardia) ([place]) Patient has a diagnosis of paroxysmal atrial fibrillation.   Patient is on no anticoagulation and has controlled ventricular response.  Patient is CV stable.      Respiratory   Chronic obstructive pulmonary disease, unspecified COPD type ([place]) An individualize plan was formulated for care of COPD.  Treatment is evidence based.  She will continue on inhalers, avoid smoking and smoke.  Regular exercise with help with dyspnea. Routine follow ups and medication compliance is needed.    OSA (obstructive sleep apnea) Using CPAP consistently every night and medically benefiting from its use.      Other   Metabolic syndrome   Relevant Orders   Hemoglobin A1c (Completed) Patient has metabolic syndrome and needs A1c checked    HLD (hyperlipidemia)   Relevant Orders   Lipid panel (Completed) AN INDIVIDUAL CARE PLAN for hyperlipidemia/ cholesterol was established and reinforced today.  The patient's status was assessed using clinical findings on exam, lab and other diagnostic tests. The patient's disease status was assessed based on evidence-based guidelines and found to be fair controlled. MEDICATIONS were reviewed. SELF MANAGEMENT GOALS have been discussed and patient's success at attaining the goal  of low cholesterol was assessed. RECOMMENDATION given include regular exercise 3 days a week and low  cholesterol/low fat diet. CLINICAL SUMMARY including written plan to identify barriers unique to the patient due to social or economic  reasons was discussed.     Erectile dysfunction Patient put on sildenophil    BMI 33.0-33.9,adult An individualize plan was formulated for obesity using patient history and physical exam to encourage weight loss.  An evidence based program was formulated.  Patient is to cut portion size with meals and to plan physical exercise 3 days a week at least 20 minutes.  Weight watchers and other programs are helpful.  Planned amount of weight loss 10 lbs.    Other Visit Diagnoses     Screening for HIV (human immunodeficiency virus)       Relevant Orders   HIV antibody (with reflex) (Completed)   Screening for colon cancer       Relevant Orders   Ambulatory referral to Gastroenterology     .    Orders Placed This Encounter  Procedures   Comprehensive metabolic panel   Hemoglobin A1c   Lipid panel   CBC with Differential/Platelet   HIV antibody (with reflex)   Cardiovascular Risk Assessment   Ambulatory referral to Gastroenterology     Follow-up: Return in about 6 months (around 09/28/2022).  An After Visit Summary was printed and given to the patient.  Reinaldo Meeker, MD [facility] [phone]

## 2022-03-28 ENCOUNTER — Encounter: Payer: Self-pay | Admitting: Legal Medicine

## 2022-03-28 ENCOUNTER — Ambulatory Visit: Payer: BC Managed Care – PPO | Admitting: Legal Medicine

## 2022-03-28 VITALS — BP 138/72 | HR 63 | Resp 18 | Ht 71.0 in | Wt 240.0 lb

## 2022-03-28 DIAGNOSIS — E782 Mixed hyperlipidemia: Secondary | ICD-10-CM

## 2022-03-28 DIAGNOSIS — I10 Essential (primary) hypertension: Secondary | ICD-10-CM

## 2022-03-28 DIAGNOSIS — J449 Chronic obstructive pulmonary disease, unspecified: Secondary | ICD-10-CM

## 2022-03-28 DIAGNOSIS — G4733 Obstructive sleep apnea (adult) (pediatric): Secondary | ICD-10-CM

## 2022-03-28 DIAGNOSIS — Z114 Encounter for screening for human immunodeficiency virus [HIV]: Secondary | ICD-10-CM

## 2022-03-28 DIAGNOSIS — E8881 Metabolic syndrome: Secondary | ICD-10-CM

## 2022-03-28 DIAGNOSIS — I471 Supraventricular tachycardia: Secondary | ICD-10-CM

## 2022-03-28 DIAGNOSIS — Z6833 Body mass index (BMI) 33.0-33.9, adult: Secondary | ICD-10-CM

## 2022-03-28 DIAGNOSIS — Z1211 Encounter for screening for malignant neoplasm of colon: Secondary | ICD-10-CM

## 2022-03-28 DIAGNOSIS — N522 Drug-induced erectile dysfunction: Secondary | ICD-10-CM

## 2022-03-28 NOTE — Patient Instructions (Signed)

## 2022-03-29 LAB — COMPREHENSIVE METABOLIC PANEL
ALT: 26 IU/L (ref 0–44)
AST: 14 IU/L (ref 0–40)
Albumin/Globulin Ratio: 2.8 — ABNORMAL HIGH (ref 1.2–2.2)
Albumin: 5 g/dL — ABNORMAL HIGH (ref 3.8–4.9)
Alkaline Phosphatase: 71 IU/L (ref 44–121)
BUN/Creatinine Ratio: 15 (ref 9–20)
BUN: 16 mg/dL (ref 6–24)
Bilirubin Total: 0.7 mg/dL (ref 0.0–1.2)
CO2: 25 mmol/L (ref 20–29)
Calcium: 10 mg/dL (ref 8.7–10.2)
Chloride: 101 mmol/L (ref 96–106)
Creatinine, Ser: 1.05 mg/dL (ref 0.76–1.27)
Globulin, Total: 1.8 g/dL (ref 1.5–4.5)
Glucose: 103 mg/dL — ABNORMAL HIGH (ref 70–99)
Potassium: 4 mmol/L (ref 3.5–5.2)
Sodium: 143 mmol/L (ref 134–144)
Total Protein: 6.8 g/dL (ref 6.0–8.5)
eGFR: 86 mL/min/{1.73_m2} (ref >59)

## 2022-03-29 LAB — LIPID PANEL
Chol/HDL Ratio: 3.2 ratio (ref 0.0–5.0)
Cholesterol, Total: 129 mg/dL (ref 100–199)
HDL: 40 mg/dL (ref >39)
LDL Chol Calc (NIH): 62 mg/dL (ref 0–99)
Triglycerides: 158 mg/dL — ABNORMAL HIGH (ref 0–149)
VLDL Cholesterol Cal: 27 mg/dL (ref 5–40)

## 2022-03-29 LAB — CBC WITH DIFFERENTIAL/PLATELET
Basophils Absolute: 0 10*3/uL (ref 0.0–0.2)
Basos: 1 %
EOS (ABSOLUTE): 0.1 10*3/uL (ref 0.0–0.4)
Eos: 2 %
Hematocrit: 48.6 % (ref 37.5–51.0)
Hemoglobin: 17.1 g/dL (ref 13.0–17.7)
Immature Grans (Abs): 0 10*3/uL (ref 0.0–0.1)
Immature Granulocytes: 0 %
Lymphocytes Absolute: 1 10*3/uL (ref 0.7–3.1)
Lymphs: 21 %
MCH: 31.3 pg (ref 26.6–33.0)
MCHC: 35.2 g/dL (ref 31.5–35.7)
MCV: 89 fL (ref 79–97)
Monocytes Absolute: 0.4 10*3/uL (ref 0.1–0.9)
Monocytes: 9 %
Neutrophils Absolute: 3.3 10*3/uL (ref 1.4–7.0)
Neutrophils: 67 %
Platelets: 186 10*3/uL (ref 150–450)
RBC: 5.47 x10E6/uL (ref 4.14–5.80)
RDW: 12.7 % (ref 11.6–15.4)
WBC: 4.9 10*3/uL (ref 3.4–10.8)

## 2022-03-29 LAB — HEMOGLOBIN A1C
Est. average glucose Bld gHb Est-mCnc: 105 mg/dL
Hgb A1c MFr Bld: 5.3 % (ref 4.8–5.6)

## 2022-03-29 LAB — HIV ANTIBODY (ROUTINE TESTING W REFLEX): HIV Screen 4th Generation wRfx: NONREACTIVE

## 2022-03-29 LAB — CARDIOVASCULAR RISK ASSESSMENT

## 2022-03-29 NOTE — Progress Notes (Signed)
Glucose 103, kidney tests, Liver tests normal, Triglycerides high 158, A1c 5.3 normal, HIV normal, CBC normal,  lp

## 2022-03-30 MED ORDER — SILDENAFIL CITRATE 100 MG PO TABS: 50.0000 mg | ORAL_TABLET | Freq: Every day | ORAL | 11 refills | Status: DC | PRN

## 2022-05-18 ENCOUNTER — Other Ambulatory Visit: Payer: Self-pay

## 2022-05-18 DIAGNOSIS — N522 Drug-induced erectile dysfunction: Secondary | ICD-10-CM

## 2022-05-18 MED ORDER — SILDENAFIL CITRATE 100 MG PO TABS: 50.0000 mg | ORAL_TABLET | Freq: Every day | ORAL | 1 refills | Status: DC | PRN

## 2022-08-18 ENCOUNTER — Other Ambulatory Visit: Payer: Self-pay | Admitting: Legal Medicine

## 2022-08-18 ENCOUNTER — Other Ambulatory Visit: Payer: Self-pay | Admitting: Family Medicine

## 2022-08-18 DIAGNOSIS — I10 Essential (primary) hypertension: Secondary | ICD-10-CM

## 2022-08-18 DIAGNOSIS — E782 Mixed hyperlipidemia: Secondary | ICD-10-CM

## 2022-10-03 ENCOUNTER — Ambulatory Visit: Payer: BC Managed Care – PPO | Admitting: Nurse Practitioner

## 2022-10-03 ENCOUNTER — Encounter: Payer: Self-pay | Admitting: Nurse Practitioner

## 2022-10-03 VITALS — BP 130/80 | HR 68 | Temp 97.3°F | Resp 18 | Ht 71.0 in | Wt 232.0 lb

## 2022-10-03 DIAGNOSIS — E782 Mixed hyperlipidemia: Secondary | ICD-10-CM

## 2022-10-03 DIAGNOSIS — Z1211 Encounter for screening for malignant neoplasm of colon: Secondary | ICD-10-CM

## 2022-10-03 DIAGNOSIS — I10 Essential (primary) hypertension: Secondary | ICD-10-CM | POA: Diagnosis not present

## 2022-10-03 DIAGNOSIS — M25551 Pain in right hip: Secondary | ICD-10-CM

## 2022-10-03 MED ORDER — MELOXICAM 7.5 MG PO TABS: 7.5000 mg | ORAL_TABLET | Freq: Every day | ORAL | 0 refills | Status: DC

## 2022-10-03 NOTE — Assessment & Plan Note (Addendum)
Well controlled Amlodipine 10 mg OD Diovan 320 mg OD Hydrochlorothiazide 25 mg OD Lab will be drawn today  Nutrition: Stressed importance of moderation in sodium intake, saturated fat and cholesterol, caloric balance, sufficient intake of complex carbohydrates, fiber, calcium and iron.   Exercise: Stressed the importance of regular exercise.

## 2022-10-03 NOTE — Assessment & Plan Note (Signed)
Continue Mobic 7.5 mg OD Continue hip exercise Will follow up with Hip X-ray  Will consider orthopedics referral and physical therapy after X-ray results

## 2022-10-03 NOTE — Patient Instructions (Addendum)
Office will call you regarding your X-ray result and lab results Office will call you for colonoscopy   Hip Exercises Ask your health care provider which exercises are safe for you. Do exercises exactly as told by your health care provider and adjust them as directed. It is normal to feel mild stretching, pulling, tightness, or discomfort as you do these exercises. Stop right away if you feel sudden pain or your pain gets worse. Do not begin these exercises until told by your health care provider. Stretching and range-of-motion exercises These exercises warm up your muscles and joints and improve the movement and flexibility of your hip. These exercises also help to relieve pain, numbness, and tingling. You may be asked to limit your range of motion if you had a hip replacement. Talk to your health care provider about these restrictions. Hamstrings, supine  Lie on your back (supine position). Loop a belt or towel over the ball of your left / right foot. The ball of your foot is on the walking surface, right under your toes. Straighten your left / right knee and slowly pull on the belt or towel to raise your leg until you feel a gentle stretch behind your knee (hamstring). Do not let your knee bend while you do this. Keep your other leg flat on the floor. Hold this position for __________ seconds. Slowly return your leg to the starting position. Repeat __________ times. Complete this exercise __________ times a day. Hip rotation  Lie on your back on a firm surface. With your left / right hand, gently pull your left / right knee toward the shoulder that is on the same side of the body. Stop when your knee is pointing toward the ceiling. Hold your left / right ankle with your other hand. Keeping your knee steady, gently pull your left / right ankle toward your other shoulder until you feel a stretch in your buttocks. Keep your hips and shoulders firmly planted while you do this stretch. Hold  this position for __________ seconds. Repeat __________ times. Complete this exercise __________ times a day. Seated stretch This exercise is sometimes called hamstrings and adductors stretch. Sit on the floor with your legs stretched wide. Keep your knees straight during this exercise. Keeping your head and back in a straight line, bend at your waist to reach for your left foot (position A). You should feel a stretch in your right inner thigh (adductors). Hold this position for __________ seconds. Then slowly return to the upright position. Keeping your head and back in a straight line, bend at your waist to reach forward (position B). You should feel a stretch behind both of your thighs and knees (hamstrings). Hold this position for __________ seconds. Then slowly return to the upright position. Keeping your head and back in a straight line, bend at your waist to reach for your right foot (position C). You should feel a stretch in your left inner thigh (adductors). Hold this position for __________ seconds. Then slowly return to the upright position. Repeat __________ times. Complete this exercise __________ times a day. Lunge This exercise stretches the muscles of the hip (hip flexors). Place your left / right knee on the floor and bend your other knee so that is directly over your ankle. You should be half-kneeling. Keep good posture with your head over your shoulders. Tighten your buttocks to point your tailbone downward. This will prevent your back from arching too much. You should feel a gentle stretch in the front  of your left / right thigh and hip. If you do not feel a stretch, slide your other foot forward slightly and then slowly lunge forward with your chest up until your knee once again lines up over your ankle. Make sure your tailbone continues to point downward. Hold this position for __________ seconds. Slowly return to the starting position. Repeat __________ times. Complete  this exercise __________ times a day. Strengthening exercises These exercises build strength and endurance in your hip. Endurance is the ability to use your muscles for a long time, even after they get tired. Bridge This exercise strengthens the muscles of your hip (hip extensors). Lie on your back on a firm surface with your knees bent and your feet flat on the floor. Tighten your buttocks muscles and lift your bottom off the floor until the trunk of your body and your hips are level with your thighs. Do not arch your back. You should feel the muscles working in your buttocks and the back of your thighs. If you do not feel these muscles, slide your feet 1-2 inches (2.5-5 cm) farther away from your buttocks. Hold this position for __________ seconds. Slowly lower your hips to the starting position. Let your muscles relax completely between repetitions. Repeat __________ times. Complete this exercise __________ times a day. Straight leg raises, side-lying This exercise strengthens the muscles that move the hip joint away from the center of the body (hip abductors). Lie on your side with your left / right leg in the top position. Lie so your head, shoulder, hip, and knee line up. You may bend your bottom knee slightly to help you balance. Roll your hips slightly forward, so your hips are stacked directly over each other and your left / right knee is facing forward. Leading with your heel, lift your top leg 4-6 inches (10-15 cm). You should feel the muscles in your top hip lifting. Do not let your foot drift forward. Do not let your knee roll toward the ceiling. Hold this position for __________ seconds. Slowly return to the starting position. Let your muscles relax completely between repetitions. Repeat __________ times. Complete this exercise __________ times a day. Straight leg raises, side-lying This exercise strengthens the muscles that move the hip joint toward the center of the body (hip  adductors). Lie on your side with your left / right leg in the bottom position. Lie so your head, shoulder, hip, and knee line up. You may place your upper foot in front to help you balance. Roll your hips slightly forward, so your hips are stacked directly over each other and your left / right knee is facing forward. Tense the muscles in your inner thigh and lift your bottom leg 4-6 inches (10-15 cm). Hold this position for __________ seconds. Slowly return to the starting position. Let your muscles relax completely between repetitions. Repeat __________ times. Complete this exercise __________ times a day. Straight leg raises, supine This exercise strengthens the muscles in the front of your thigh (quadriceps). Lie on your back (supine position) with your left / right leg extended and your other knee bent. Tense the muscles in the front of your left / right thigh. You should see your kneecap slide up or see increased dimpling just above your knee. Keep these muscles tight as you raise your leg 4-6 inches (10-15 cm) off the floor. Do not let your knee bend. Hold this position for __________ seconds. Keep these muscles tense as you lower your leg. Relax the muscles  slowly and completely between repetitions. Repeat __________ times. Complete this exercise __________ times a day. Hip abductors, standing This exercise strengthens the muscles that move the leg and hip joint away from the center of the body (hip abductors). Tie one end of a rubber exercise band or tubing to a secure surface, such as a chair, table, or pole. Loop the other end of the band or tubing around your left / right ankle. Keeping your ankle with the band or tubing directly opposite the secured end, step away until there is tension in the tubing or band. Hold on to a chair, table, or pole as needed for balance. Lift your left / right leg out to your side. While you do this: Keep your back upright. Keep your shoulders over  your hips. Keep your toes pointing forward. Make sure to use your hip muscles to slowly lift your leg. Do not tip your body or forcefully lift your leg. Hold this position for __________ seconds. Slowly return to the starting position. Repeat __________ times. Complete this exercise __________ times a day. Squats This exercise strengthens the muscles in the front of your thigh (quadriceps). Stand in a door frame so your feet and knees are in line with the frame. You may place your hands on the frame for balance. Slowly bend your knees and lower your hips like you are going to sit in a chair. Keep your lower legs in a straight-up-and-down position. Do not let your hips go lower than your knees. Do not bend your knees lower than told by your health care provider. If your hip pain increases, do not bend as low. Hold this position for ___________ seconds. Slowly push with your legs to return to standing. Do not use your hands to pull yourself to standing. Repeat __________ times. Complete this exercise __________ times a day. This information is not intended to replace advice given to you by your health care provider. Make sure you discuss any questions you have with your health care provider. Document Revised: 12/14/2020 Document Reviewed: 12/14/2020 Elsevier Patient Education  [place].

## 2022-10-03 NOTE — Progress Notes (Signed)
Subjective:  Patient ID: Timothy Small, male    DOB: 11/06/1970  Age: 52 y.o. MRN: [id number]  Chief Complaint  Patient presents with   Hypertension   Hyperlipidemia        Right hip pain  History of Present illness:  Patient is here for a regular follow up for his hypertension, and hyperlipidemia. He is pretty active person. Works in a [employer]. He is moving a lot in his work. Lately he is having a right hip pain and he is taking OTC ibuprofen for this. Pain comes frequently and making his activities worse. He wants to know what is going on.   RIght hip pain:  Duration: chronic Involved hip: right  Mechanism of injury: unknown Location: diffuse Onset: gradual  Severity: 4/10  Quality: constant ache  Frequency: a few times a hour Radiation: no Aggravating factors: none Alleviating factors: OTC pain relievers Status: better, stable, and fluctuating Treatments attempted: Ibuprofen Relief with NSAIDs?: no Weakness with weight bearing: no Weakness with walking: no Paresthesias / decreased sensation: no Swelling: no Redness:no Fevers: no   For his HYPERTENSION:  He was last seen for hypertension 6 months ago.  BP at that visit was 138/72. Management includes valsartan., hydrochlorothiazide. Amolodipine.  Bp at home : he rarely check at home  He reports good compliance with treatment. He is not having side effects. He is following a Regular diet. He is exercising. He does not smoke. Patient goes to regular eye care  Use of agents associated with hypertension: none.   Outside blood pressures are  130's/70-80's.  Pertinent labs: Lab Results  Component Value Date   CHOL 129 03/28/2022   HDL 40 03/28/2022   LDLCALC 62 03/28/2022   LDLDIRECT 77.0 09/12/2016   TRIG 158 (H) 03/28/2022   CHOLHDL 3.2 03/28/2022   Lab Results  Component Value Date   NA 143 03/28/2022   K 4.0 03/28/2022   CREATININE 1.05 03/28/2022   EGFR 86 03/28/2022   GFRNONAA 71  09/19/2020   GLUCOSE 103 (H) 03/28/2022     The ASCVD Risk score (Arnett DK, et al., 2019) failed to calculate for the following reasons:   The valid total cholesterol range is 130 to 320 mg/dL    Lipid/Cholesterol, Follow-up  Last lipid panel Other pertinent labs  Lab Results  Component Value Date   CHOL 129 03/28/2022   HDL 40 03/28/2022   LDLCALC 62 03/28/2022   LDLDIRECT 77.0 09/12/2016   TRIG 158 (H) 03/28/2022   CHOLHDL 3.2 03/28/2022   Lab Results  Component Value Date   ALT 26 03/28/2022   AST 14 03/28/2022   PLT 186 03/28/2022   TSH 2.240 09/27/2021     He was last seen for this 3 months ago.  Management includes Crestor.  He reports excellent compliance with treatment. He is having side effects.   Current diet: in general, a "healthy" diet   Current exercise: walking  The ASCVD Risk score (Arnett DK, et al., 2019) failed to calculate for the following reasons:   The valid total cholesterol range is 130 to 320 mg/dL     Current Outpatient Medications on File Prior to Visit  Medication Sig Dispense Refill   amLODipine (NORVASC) 10 MG tablet TAKE 1 TABLET BY MOUTH EVERY DAY IN THE MORNING 90 tablet 1   atorvastatin (LIPITOR) 40 MG tablet TAKE 1 TABLET BY MOUTH EVERY DAY 90 tablet 2   hydrochlorothiazide (HYDRODIURIL) 25 MG tablet TAKE 1 TABLET BY MOUTH  EVERY DAY 90 tablet 2   sildenafil (VIAGRA) 100 MG tablet Take 0.5-1 tablets (50-100 mg total) by mouth daily as needed for erectile dysfunction. (Patient taking differently: Take 20 mg by mouth daily as needed for erectile dysfunction.) 5 tablet 1   valsartan (DIOVAN) 320 MG tablet TAKE 1 TABLET BY MOUTH EVERY DAY 90 tablet 2   No current facility-administered medications on file prior to visit.   Past Medical History:  Diagnosis Date   BMI 34.0-34.9,adult 11/17/2020   BMI 35.0-35.9,adult    BRONCHIECTASIS W/O ACUTE EXACERBATION 09/14/2009   Qualifier: Diagnosis of  By: Melvyn Novas MD, Christena Deem    Chest pain  11/17/2020   Chronic obstructive pulmonary disease, unspecified COPD type ([place]) 09/19/2020   Dizziness 11/17/2020   ED (erectile dysfunction)    Erectile dysfunction 08/14/2015   Essential hypertension 09/14/2009   Qualifier: Diagnosis of  By: Tilden Dome     HLD (hyperlipidemia) 07/21/2015   Hypertension    Metabolic syndrome 0000000   Multiple atypical nevi 09/13/2016   Nephrolithiasis 12/16/2015   OSA (obstructive sleep apnea)    s/p  osa surgery 2011-- sleep study repeated after surgery per pt stated mild osa no cpap   Palpitations 11/17/2020   Renal cyst 08/13/2008   multiple   Right ureteral stone    Routine general medical examination at a health care facility 09/13/2016   Shingles 11/01/2020   Shortness of breath 11/17/2020   Syncope 11/01/2020   Syncope and collapse 11/17/2020   Past Surgical History:  Procedure Laterality Date   CYSTOSCOPY/RETROGRADE/URETEROSCOPY/STONE EXTRACTION WITH BASKET Right 12/23/2015   Procedure: CYSTOSCOPY/RIGHT RETROGRADE/ PYELOGRAM URETEROSCOPY/STONE EXTRACTION WITH BASKET;  Surgeon: Kathie Rhodes, MD;  Location: [facility];  Service: Urology;  Laterality: Right;   UVULOPALATOPHARYNGOPLASTY  2011   and Tonsillectomy/ Adenoidectomy for sleep apnea    Family History  Problem Relation Age of Onset   Cancer Father        bladder Ca    Heart disease Maternal Aunt 84       heart failure   Heart disease Maternal Uncle    Heart disease Maternal Grandmother    Heart disease Maternal Grandfather    Hypertension Brother    Social History   Socioeconomic History   Marital status: Married    Spouse name: [name]   Number of children: 2   Years of education: Not on file   Highest education level: Not on file  Occupational History    Employer: [employer]  Tobacco Use   Smoking status: Never   Smokeless tobacco: Current    Types: Snuff   Tobacco comments:    1 can every two days  Vaping Use   Vaping Use: Never used  Substance and Sexual Activity    Alcohol use: Yes    Alcohol/week: 4.0 standard drinks of alcohol    Types: 4 Cans of beer per week    Comment: socially   Drug use: No   Sexual activity: Yes    Partners: Female  Other Topics Concern   Not on file  Social History Narrative   Not on file   Social Determinants of Health   Financial Resource Strain: Not on file  Food Insecurity: Not on file  Transportation Needs: Not on file  Physical Activity: Not on file  Stress: Not on file  Social Connections: Not on file    Review of Systems  Constitutional:  Negative for chills, fatigue and fever.  HENT:  Negative for congestion, ear  pain and sore throat.   Respiratory:  Negative for cough and shortness of breath.   Cardiovascular:  Negative for chest pain and palpitations.  Gastrointestinal:  Negative for abdominal pain, constipation, diarrhea, nausea and vomiting.  Endocrine: Negative for polydipsia, polyphagia and polyuria.  Genitourinary:  Negative for dysuria and frequency.  Musculoskeletal:  Positive for arthralgias (generalized body ache and right hip pain). Negative for back pain.  Neurological:  Negative for dizziness and headaches.  Psychiatric/Behavioral:  Negative for dysphoric mood. The patient is not nervous/anxious.      Objective:  BP 130/80 (BP Location: Right Arm)   Pulse 68   Temp (!) 97.3 F (36.3 C)   Resp 18   Ht 5' 11"$  (1.803 m)   Wt 232 lb (105.2 kg)   SpO2 99%   BMI 32.36 kg/m      10/03/2022   10:44 AM 10/03/2022    7:30 AM 03/28/2022    7:31 AM  BP/Weight  Systolic BP [id number]  Diastolic BP 80 80 72  Wt. (Lbs)  232 240  BMI  32.36 kg/m2 33.47 kg/m2    Physical Exam Vitals reviewed.  Constitutional:      Appearance: Normal appearance.  Neck:     Vascular: No carotid bruit.  Cardiovascular:     Rate and Rhythm: Normal rate and regular rhythm.     Pulses: Normal pulses.     Heart sounds: Normal heart sounds.  Pulmonary:     Breath sounds: Normal breath sounds.   Abdominal:     Tenderness: There is no abdominal tenderness.  Musculoskeletal:        General: Tenderness present. No deformity or signs of injury.     Right lower leg: No edema.     Left lower leg: No edema.     Comments: Right hip tenderness  Neurological:     Mental Status: He is alert and oriented to person, place, and time.  Psychiatric:        Mood and Affect: Mood normal.        Behavior: Behavior normal.         10/03/2022   11:49 AM 03/28/2022    7:33 AM 03/20/2021    7:33 AM 09/19/2020    7:43 AM 09/18/2019   10:28 AM  Depression screen PHQ 2/9  Decreased Interest  0 0 0 0  Down, Depressed, Hopeless 0 0 0 0 0  PHQ - 2 Score 0 0 0 0 0       03/18/2020    7:50 AM 09/19/2020    7:43 AM 03/28/2022    7:33 AM 10/03/2022   11:49 AM  Fall Risk  Falls in the past year? 0 0 0 0  Was there an injury with Fall? 0  0 0  Fall Risk Category Calculator 0  0 0  Fall Risk Category (Retired) Low  Low   (RETIRED) Patient Fall Risk Level Low fall risk  Low fall risk   Patient at Risk for Falls Due to   No Fall Risks   Fall risk Follow up Falls evaluation completed Falls evaluation completed Falls evaluation completed     Lab Results  Component Value Date   WBC 4.9 03/28/2022   HGB 17.1 03/28/2022   HCT 48.6 03/28/2022   PLT 186 03/28/2022   GLUCOSE 103 (H) 03/28/2022   CHOL 129 03/28/2022   TRIG 158 (H) 03/28/2022   HDL 40 03/28/2022   LDLDIRECT 77.0 09/12/2016   LDLCALC 62  03/28/2022   ALT 26 03/28/2022   AST 14 03/28/2022   NA 143 03/28/2022   K 4.0 03/28/2022   CL 101 03/28/2022   CREATININE 1.05 03/28/2022   BUN 16 03/28/2022   CO2 25 03/28/2022   TSH 2.240 09/27/2021   HGBA1C 5.3 03/28/2022    Assessment & Plan:   Essential hypertension Assessment & Plan: Well controlled Amlodipine 10 mg OD Diovan 320 mg OD Hydrochlorothiazide 25 mg OD Lab will be drawn today  Nutrition: Stressed importance of moderation in sodium intake, saturated fat and cholesterol,  caloric balance, sufficient intake of complex carbohydrates, fiber, calcium and iron.   Exercise: Stressed the importance of regular exercise.     Orders: -     CBC with Differential/Platelet -     Lipid panel -     TSH -     Comprehensive metabolic panel  Mixed hyperlipidemia Assessment & Plan: Controlled with Lipitor 40 mg OD  Nutrition: Stressed importance of moderation in sodium intake, saturated fat and cholesterol, caloric balance, sufficient intake of complex carbohydrates, fiber, calcium and iron.   Exercise: Stressed the importance of regular exercise.     Orders: -     CBC with Differential/Platelet -     Lipid panel -     TSH -     Comprehensive metabolic panel  Screening for colon cancer -     Ambulatory referral to Gastroenterology  Right hip pain Assessment & Plan: Continue Mobic 7.5 mg OD Continue hip exercise Will follow up with Hip X-ray  Will consider orthopedics referral and physical therapy after X-ray results  Orders: -     DG Arthro Hip Right -     Meloxicam; Take 1 tablet (7.5 mg total) by mouth daily.  Dispense: 30 tablet; Refill: 0     Follow-up: Return in about 3 months (around 01/01/2023) for CHRONIC, FASTING.  An After Visit Summary was printed and given to the patient.  I, Neil Crouch have reviewed all documentation for this visit. The documentation on 10/03/22   for the exam, diagnosis, procedures, and orders are all accurate and complete.    Neil Crouch, DNP, [facility] [phone]

## 2022-10-03 NOTE — Assessment & Plan Note (Signed)
Controlled with Lipitor 40 mg OD  Nutrition: Stressed importance of moderation in sodium intake, saturated fat and cholesterol, caloric balance, sufficient intake of complex carbohydrates, fiber, calcium and iron.   Exercise: Stressed the importance of regular exercise.

## 2022-10-04 LAB — LIPID PANEL
Chol/HDL Ratio: 2.4 ratio (ref 0.0–5.0)
Cholesterol, Total: 110 mg/dL (ref 100–199)
HDL: 45 mg/dL (ref >39)
LDL Chol Calc (NIH): 52 mg/dL (ref 0–99)
Triglycerides: 55 mg/dL (ref 0–149)
VLDL Cholesterol Cal: 13 mg/dL (ref 5–40)

## 2022-10-04 LAB — CBC WITH DIFFERENTIAL/PLATELET
Basophils Absolute: 0 10*3/uL (ref 0.0–0.2)
Basos: 1 %
EOS (ABSOLUTE): 0.1 10*3/uL (ref 0.0–0.4)
Eos: 1 %
Hematocrit: 46.7 % (ref 37.5–51.0)
Hemoglobin: 16.3 g/dL (ref 13.0–17.7)
Immature Grans (Abs): 0 10*3/uL (ref 0.0–0.1)
Immature Granulocytes: 0 %
Lymphocytes Absolute: 0.8 10*3/uL (ref 0.7–3.1)
Lymphs: 23 %
MCH: 30.4 pg (ref 26.6–33.0)
MCHC: 34.9 g/dL (ref 31.5–35.7)
MCV: 87 fL (ref 79–97)
Monocytes Absolute: 0.4 10*3/uL (ref 0.1–0.9)
Monocytes: 12 %
Neutrophils Absolute: 2.2 10*3/uL (ref 1.4–7.0)
Neutrophils: 63 %
Platelets: 164 10*3/uL (ref 150–450)
RBC: 5.37 x10E6/uL (ref 4.14–5.80)
RDW: 12.7 % (ref 11.6–15.4)
WBC: 3.6 10*3/uL (ref 3.4–10.8)

## 2022-10-04 LAB — COMPREHENSIVE METABOLIC PANEL
ALT: 23 IU/L (ref 0–44)
AST: 19 IU/L (ref 0–40)
Albumin/Globulin Ratio: 2.6 — ABNORMAL HIGH (ref 1.2–2.2)
Albumin: 4.7 g/dL (ref 3.8–4.9)
Alkaline Phosphatase: 65 IU/L (ref 44–121)
BUN/Creatinine Ratio: 12 (ref 9–20)
BUN: 14 mg/dL (ref 6–24)
Bilirubin Total: 0.4 mg/dL (ref 0.0–1.2)
CO2: 23 mmol/L (ref 20–29)
Calcium: 9.4 mg/dL (ref 8.7–10.2)
Chloride: 102 mmol/L (ref 96–106)
Creatinine, Ser: 1.13 mg/dL (ref 0.76–1.27)
Globulin, Total: 1.8 g/dL (ref 1.5–4.5)
Glucose: 114 mg/dL — ABNORMAL HIGH (ref 70–99)
Potassium: 4.5 mmol/L (ref 3.5–5.2)
Sodium: 142 mmol/L (ref 134–144)
Total Protein: 6.5 g/dL (ref 6.0–8.5)
eGFR: 79 mL/min/{1.73_m2} (ref >59)

## 2022-10-04 LAB — CARDIOVASCULAR RISK ASSESSMENT

## 2022-10-04 LAB — TSH: TSH: 3.36 u[IU]/mL (ref 0.450–4.500)

## 2022-10-05 ENCOUNTER — Other Ambulatory Visit: Payer: Self-pay | Admitting: Nurse Practitioner

## 2022-10-05 DIAGNOSIS — M25551 Pain in right hip: Secondary | ICD-10-CM

## 2022-11-01 ENCOUNTER — Other Ambulatory Visit: Payer: Self-pay | Admitting: Nurse Practitioner

## 2022-11-01 DIAGNOSIS — M25551 Pain in right hip: Secondary | ICD-10-CM

## 2022-11-03 ENCOUNTER — Ambulatory Visit: Payer: BC Managed Care – PPO | Admitting: Nurse Practitioner

## 2023-01-18 ENCOUNTER — Ambulatory Visit: Payer: BC Managed Care – PPO | Admitting: Family Medicine

## 2023-02-26 ENCOUNTER — Other Ambulatory Visit: Payer: Self-pay | Admitting: Family Medicine

## 2023-02-26 DIAGNOSIS — I10 Essential (primary) hypertension: Secondary | ICD-10-CM

## 2023-06-17 ENCOUNTER — Other Ambulatory Visit: Payer: Self-pay | Admitting: Family Medicine

## 2023-06-17 DIAGNOSIS — I10 Essential (primary) hypertension: Secondary | ICD-10-CM

## 2023-06-24 ENCOUNTER — Other Ambulatory Visit: Payer: Self-pay | Admitting: Family Medicine

## 2023-06-25 ENCOUNTER — Other Ambulatory Visit: Payer: Self-pay

## 2023-06-25 DIAGNOSIS — N522 Drug-induced erectile dysfunction: Secondary | ICD-10-CM

## 2023-06-27 ENCOUNTER — Other Ambulatory Visit: Payer: Self-pay | Admitting: Family Medicine

## 2023-07-01 ENCOUNTER — Other Ambulatory Visit: Payer: Self-pay | Admitting: Family Medicine

## 2023-07-01 DIAGNOSIS — I10 Essential (primary) hypertension: Secondary | ICD-10-CM

## 2023-07-15 ENCOUNTER — Ambulatory Visit: Payer: Commercial Managed Care - PPO | Admitting: Physician Assistant

## 2023-07-15 ENCOUNTER — Encounter: Payer: Self-pay | Admitting: Physician Assistant

## 2023-07-15 VITALS — BP 152/88 | HR 91 | Temp 97.8°F | Ht 71.0 in | Wt 240.0 lb

## 2023-07-15 DIAGNOSIS — E782 Mixed hyperlipidemia: Secondary | ICD-10-CM | POA: Diagnosis not present

## 2023-07-15 DIAGNOSIS — Z125 Encounter for screening for malignant neoplasm of prostate: Secondary | ICD-10-CM | POA: Diagnosis not present

## 2023-07-15 DIAGNOSIS — R739 Hyperglycemia, unspecified: Secondary | ICD-10-CM | POA: Insufficient documentation

## 2023-07-15 DIAGNOSIS — I10 Essential (primary) hypertension: Secondary | ICD-10-CM | POA: Diagnosis not present

## 2023-07-15 DIAGNOSIS — N522 Drug-induced erectile dysfunction: Secondary | ICD-10-CM

## 2023-07-15 DIAGNOSIS — Z1211 Encounter for screening for malignant neoplasm of colon: Secondary | ICD-10-CM | POA: Insufficient documentation

## 2023-07-15 MED ORDER — SILDENAFIL CITRATE 20 MG PO TABS: ORAL_TABLET | ORAL | 5 refills | Status: DC

## 2023-07-15 MED ORDER — VALSARTAN 320 MG PO TABS: 320.0000 mg | ORAL_TABLET | Freq: Every day | ORAL | 1 refills | Status: DC

## 2023-07-15 MED ORDER — AMLODIPINE BESYLATE 10 MG PO TABS
10.0000 mg | ORAL_TABLET | Freq: Every day | ORAL | 1 refills | Status: DC
Start: 2023-07-15 — End: 2023-12-24

## 2023-07-15 MED ORDER — HYDROCHLOROTHIAZIDE 25 MG PO TABS
25.0000 mg | ORAL_TABLET | Freq: Every day | ORAL | 1 refills | Status: DC
Start: 2023-07-15 — End: 2023-08-20

## 2023-07-15 MED ORDER — ATORVASTATIN CALCIUM 40 MG PO TABS
40.0000 mg | ORAL_TABLET | Freq: Every day | ORAL | 1 refills | Status: DC
Start: 2023-07-15 — End: 2023-12-24

## 2023-07-15 NOTE — Progress Notes (Signed)
Subjective:  Patient ID: Timothy Small, male    DOB: 07-30-1971  Age: 52 y.o. MRN: [id number]  Chief Complaint  Patient presents with   Medical Management of Chronic Issues   PT NEW TO ME _ DR PERRY/REKHA PT HPI Pt presents for follow up of hypertension. The patient is tolerating the medication well without side effects. Compliance with treatment has been good; including taking medication as directed , maintains a healthy diet and regular exercise regimen , and following up as directed. He states bp has been doing well - is elevated today but had not taken medication until a few minutes before visit Denies chest pain/sob/edema Current meds include norvasc 10mg , hctz 25mg  and diovan 320mg   Mixed hyperlipidemia  Pt presents with hyperlipidemia. Compliance with treatment has been good - The patient is compliant with medications, maintains a low cholesterol diet , follows up as directed , and maintains an exercise regimen . The patient denies experiencing any hypercholesterolemia related symptoms. Takes lipitor 40mg  qd  Pt with erectile dysfunction - uses revatio prn - requests refill  Last labwork showed slightly elevated glucose - will check Hgb A1c  Pt due to check PSA - no symptoms  Pt would like to schedule screening colonoscopy       07/15/2023   10:05 AM 10/03/2022   11:49 AM 03/28/2022    7:33 AM 03/20/2021    7:33 AM 09/19/2020    7:43 AM  Depression screen PHQ 2/9  Decreased Interest 0  0 0 0  Down, Depressed, Hopeless 0 0 0 0 0  PHQ - 2 Score 0 0 0 0 0  Altered sleeping 0      Tired, decreased energy 0      Change in appetite 0      Feeling bad or failure about yourself  0      Trouble concentrating 0      Moving slowly or fidgety/restless 0      Suicidal thoughts 0      PHQ-9 Score 0      Difficult doing work/chores Not difficult at all            03/18/2020    7:50 AM 09/19/2020    7:43 AM 03/28/2022    7:33 AM 10/03/2022   11:49 AM 07/15/2023   10:05 AM  Fall  Risk  Falls in the past year? 0 0 0 0 0  Was there an injury with Fall? 0  0 0 0  Fall Risk Category Calculator 0  0 0 0  Fall Risk Category (Retired) Low  Low    (RETIRED) Patient Fall Risk Level Low fall risk  Low fall risk    Patient at Risk for Falls Due to   No Fall Risks  No Fall Risks  Fall risk Follow up Falls evaluation completed Falls evaluation completed Falls evaluation completed  Falls evaluation completed     ROS CONSTITUTIONAL: Negative for chills, fatigue, fever, unintentional weight gain and unintentional weight loss.  CARDIOVASCULAR: Negative for chest pain, dizziness, palpitations and pedal edema.  RESPIRATORY: Negative for recent cough and dyspnea.  GASTROINTESTINAL: Negative for abdominal pain, acid reflux symptoms, constipation, diarrhea, nausea and vomiting.  MSK: Negative for arthralgias and myalgias.  INTEGUMENTARY: Negative for rash.  PSYCHIATRIC: Negative for sleep disturbance and to question depression screen.  Negative for depression, negative for anhedonia.    Current Outpatient Medications:    amLODipine (NORVASC) 10 MG tablet, Take 1 tablet (10 mg total) by mouth  daily., Disp: 90 tablet, Rfl: 1   atorvastatin (LIPITOR) 40 MG tablet, Take 1 tablet (40 mg total) by mouth daily., Disp: 90 tablet, Rfl: 1   hydrochlorothiazide (HYDRODIURIL) 25 MG tablet, Take 1 tablet (25 mg total) by mouth daily., Disp: 90 tablet, Rfl: 1   sildenafil (REVATIO) 20 MG tablet, 1 po qd prn, Disp: 30 tablet, Rfl: 5   valsartan (DIOVAN) 320 MG tablet, Take 1 tablet (320 mg total) by mouth daily., Disp: 90 tablet, Rfl: 1  Past Medical History:  Diagnosis Date   BMI 34.0-34.9,adult 11/17/2020   BMI 35.0-35.9,adult    BRONCHIECTASIS W/O ACUTE EXACERBATION 09/14/2009   Qualifier: Diagnosis of  By: Sherene Sires MD, Charlaine Dalton    Chest pain 11/17/2020   Chronic obstructive pulmonary disease, unspecified COPD type ([facility]) 09/19/2020   Dizziness 11/17/2020   ED (erectile dysfunction)    Erectile  dysfunction 08/14/2015   Essential hypertension 09/14/2009   Qualifier: Diagnosis of  By: Vernie Murders     HLD (hyperlipidemia) 07/21/2015   Hypertension    Metabolic syndrome 07/21/2015   Multiple atypical nevi 09/13/2016   Nephrolithiasis 12/16/2015   OSA (obstructive sleep apnea)    s/p  osa surgery 2011-- sleep study repeated after surgery per pt stated mild osa no cpap   Palpitations 11/17/2020   Renal cyst 08/13/2008   multiple   Right ureteral stone    Routine general medical examination at a health care facility 09/13/2016   Shingles 11/01/2020   Shortness of breath 11/17/2020   Syncope 11/01/2020   Syncope and collapse 11/17/2020   Objective:  PHYSICAL EXAM:   BP (!) 152/88   Pulse 91   Temp 97.8 F (36.6 C) (Temporal)   Ht 5\' 11"  (1.803 m)   Wt 240 lb (108.9 kg)   SpO2 95%   BMI 33.47 kg/m    GEN: Well nourished, well developed, in no acute distress   Cardiac: RRR; no murmurs, rubs, or gallops,no edema -  Respiratory:  normal respiratory rate and pattern with no distress - normal breath sounds with no rales, rhonchi, wheezes or rubs GI: normal bowel sounds, no masses or tenderness MS: no deformity or atrophy  Skin: warm and dry, no rash   Psych: euthymic mood, appropriate affect and demeanor  Assessment & Plan:    Essential hypertension -     CBC with Differential/Platelet -     Comprehensive metabolic panel -     TSH -     hydroCHLOROthiazide; Take 1 tablet (25 mg total) by mouth daily.  Dispense: 90 tablet; Refill: 1 -     Valsartan; Take 1 tablet (320 mg total) by mouth daily.  Dispense: 90 tablet; Refill: 1 Refill norvasc Continue meds Recheck bp one month Mixed hyperlipidemia -     Comprehensive metabolic panel -     Lipid panel -     Atorvastatin Calcium; Take 1 tablet (40 mg total) by mouth daily.  Dispense: 90 tablet; Refill: 1 Watch diet Hyperglycemia -     Hemoglobin A1c  Prostate cancer screening -     PSA   Drug-induced erectile dysfunction -      Sildenafil Citrate; 1 po qd prn  Dispense: 30 tablet; Refill: 5  Screen for colon cancer -     Ambulatory referral to Gastroenterology     Follow-up: Return in about 6 months (around 01/13/2024) for fasting physical - 20 min- nurse visit 1 month for bp check.  An After Visit Summary was printed and given  to the patient.  Jettie Pagan [facility] [phone]

## 2023-07-16 LAB — COMPREHENSIVE METABOLIC PANEL
ALT: 39 [IU]/L (ref 0–44)
AST: 23 [IU]/L (ref 0–40)
Albumin: 5 g/dL — ABNORMAL HIGH (ref 3.8–4.9)
Alkaline Phosphatase: 86 [IU]/L (ref 44–121)
BUN/Creatinine Ratio: 12 (ref 9–20)
BUN: 12 mg/dL (ref 6–24)
Bilirubin Total: 0.4 mg/dL (ref 0.0–1.2)
CO2: 24 mmol/L (ref 20–29)
Calcium: 9.7 mg/dL (ref 8.7–10.2)
Chloride: 99 mmol/L (ref 96–106)
Creatinine, Ser: 1.04 mg/dL (ref 0.76–1.27)
Globulin, Total: 2.2 g/dL (ref 1.5–4.5)
Glucose: 108 mg/dL — ABNORMAL HIGH (ref 70–99)
Potassium: 4 mmol/L (ref 3.5–5.2)
Sodium: 140 mmol/L (ref 134–144)
Total Protein: 7.2 g/dL (ref 6.0–8.5)
eGFR: 86 mL/min/{1.73_m2} (ref >59)

## 2023-07-16 LAB — CBC WITH DIFFERENTIAL/PLATELET
Basophils Absolute: 0 10*3/uL (ref 0.0–0.2)
Basos: 1 %
EOS (ABSOLUTE): 0.1 10*3/uL (ref 0.0–0.4)
Eos: 1 %
Hematocrit: 53.6 % — ABNORMAL HIGH (ref 37.5–51.0)
Hemoglobin: 18.5 g/dL — ABNORMAL HIGH (ref 13.0–17.7)
Immature Grans (Abs): 0 10*3/uL (ref 0.0–0.1)
Immature Granulocytes: 0 %
Lymphocytes Absolute: 1 10*3/uL (ref 0.7–3.1)
Lymphs: 16 %
MCH: 30.6 pg (ref 26.6–33.0)
MCHC: 34.5 g/dL (ref 31.5–35.7)
MCV: 89 fL (ref 79–97)
Monocytes Absolute: 0.6 10*3/uL (ref 0.1–0.9)
Monocytes: 10 %
Neutrophils Absolute: 4.2 10*3/uL (ref 1.4–7.0)
Neutrophils: 72 %
Platelets: 195 10*3/uL (ref 150–450)
RBC: 6.04 x10E6/uL — ABNORMAL HIGH (ref 4.14–5.80)
RDW: 12.5 % (ref 11.6–15.4)
WBC: 5.9 10*3/uL (ref 3.4–10.8)

## 2023-07-16 LAB — HEMOGLOBIN A1C
Est. average glucose Bld gHb Est-mCnc: 105 mg/dL
Hgb A1c MFr Bld: 5.3 % (ref 4.8–5.6)

## 2023-07-16 LAB — LIPID PANEL
Chol/HDL Ratio: 5.2 {ratio} — ABNORMAL HIGH (ref 0.0–5.0)
Cholesterol, Total: 240 mg/dL — ABNORMAL HIGH (ref 100–199)
HDL: 46 mg/dL (ref >39)
LDL Chol Calc (NIH): 157 mg/dL — ABNORMAL HIGH (ref 0–99)
Triglycerides: 204 mg/dL — ABNORMAL HIGH (ref 0–149)
VLDL Cholesterol Cal: 37 mg/dL (ref 5–40)

## 2023-07-16 LAB — PSA: Prostate Specific Ag, Serum: 1.5 ng/mL (ref 0.0–4.0)

## 2023-07-16 LAB — TSH: TSH: 4.26 u[IU]/mL (ref 0.450–4.500)

## 2023-08-19 ENCOUNTER — Ambulatory Visit: Payer: Commercial Managed Care - PPO

## 2023-08-19 NOTE — Progress Notes (Signed)
 Patient is in office today for a nurse visit for Blood Pressure Check. Patient blood pressure was 150/94, Patient No chest pain, No shortness of breath, No dyspnea on exertion, No orthopnea, No paroxysmal nocturnal dyspnea, No edema, No palpitations, No syncope  Patient stated that he took his Blood Pressure medication this morning at 6 AM.  Recheck Blood Pressure after 10 Minutes: 148/94

## 2023-08-20 ENCOUNTER — Other Ambulatory Visit: Payer: Self-pay | Admitting: Family Medicine

## 2023-08-20 DIAGNOSIS — I10 Essential (primary) hypertension: Secondary | ICD-10-CM

## 2023-09-09 ENCOUNTER — Ambulatory Visit (INDEPENDENT_AMBULATORY_CARE_PROVIDER_SITE_OTHER): Payer: Commercial Managed Care - PPO

## 2023-09-09 VITALS — BP 138/64

## 2023-09-09 DIAGNOSIS — I10 Essential (primary) hypertension: Secondary | ICD-10-CM

## 2023-09-09 NOTE — Progress Notes (Addendum)
Patient is in office today for a nurse visit for Blood Pressure Check. Patient blood pressure was 138/64, Patient No chest pain, No shortness of breath, No dyspnea on exertion, No orthopnea, No paroxysmal nocturnal dyspnea, No edema, No palpitations, No syncope.  Patient has been keeping a log of BP's at home.  1/10 4:00 pm - 139/89 1/12 8:30 am - 143/96 1/14 8:30 pm - 128/74 1/16 11:30 am - 138/88 1/18 8:00 pm - 134/82 1/20 8:30 am - 148/89 1/22 4:00 pm - 143/97 1/24 9:30 am - 142/88 1/25 9:00 am - 134/86 1/25 2:30 am - 129/78 1/26 8:30 am - 135/87

## 2023-09-17 ENCOUNTER — Encounter: Payer: Self-pay | Admitting: Physician Assistant

## 2023-12-24 ENCOUNTER — Other Ambulatory Visit: Payer: Self-pay | Admitting: Physician Assistant

## 2023-12-24 DIAGNOSIS — I10 Essential (primary) hypertension: Secondary | ICD-10-CM

## 2023-12-24 DIAGNOSIS — E782 Mixed hyperlipidemia: Secondary | ICD-10-CM

## 2024-01-17 ENCOUNTER — Encounter: Payer: Self-pay | Admitting: Physician Assistant

## 2024-01-17 ENCOUNTER — Ambulatory Visit (INDEPENDENT_AMBULATORY_CARE_PROVIDER_SITE_OTHER): Admitting: Physician Assistant

## 2024-01-17 VITALS — BP 120/80 | HR 73 | Temp 97.4°F | Resp 16 | Ht 71.0 in | Wt 241.0 lb

## 2024-01-17 DIAGNOSIS — H9313 Tinnitus, bilateral: Secondary | ICD-10-CM | POA: Diagnosis not present

## 2024-01-17 DIAGNOSIS — E782 Mixed hyperlipidemia: Secondary | ICD-10-CM

## 2024-01-17 DIAGNOSIS — I10 Essential (primary) hypertension: Secondary | ICD-10-CM

## 2024-01-17 DIAGNOSIS — R739 Hyperglycemia, unspecified: Secondary | ICD-10-CM | POA: Diagnosis not present

## 2024-01-17 DIAGNOSIS — Z0000 Encounter for general adult medical examination without abnormal findings: Secondary | ICD-10-CM

## 2024-01-17 DIAGNOSIS — N522 Drug-induced erectile dysfunction: Secondary | ICD-10-CM

## 2024-01-17 DIAGNOSIS — Z1211 Encounter for screening for malignant neoplasm of colon: Secondary | ICD-10-CM

## 2024-01-17 LAB — POCT URINALYSIS DIP (CLINITEK)
Bilirubin, UA: NEGATIVE
Blood, UA: NEGATIVE
Glucose, UA: NEGATIVE mg/dL
Ketones, POC UA: NEGATIVE mg/dL
Leukocytes, UA: NEGATIVE
Nitrite, UA: NEGATIVE
POC PROTEIN,UA: NEGATIVE
Spec Grav, UA: 1.015 (ref 1.010–1.025)
Urobilinogen, UA: 0.2 U/dL
pH, UA: 6.5 (ref 5.0–8.0)

## 2024-01-17 MED ORDER — AMLODIPINE BESYLATE 10 MG PO TABS: 10.0000 mg | ORAL_TABLET | Freq: Every day | ORAL | 1 refills | Status: DC

## 2024-01-17 MED ORDER — ATORVASTATIN CALCIUM 40 MG PO TABS: 40.0000 mg | ORAL_TABLET | Freq: Every day | ORAL | 1 refills | Status: DC

## 2024-01-17 MED ORDER — VALSARTAN 320 MG PO TABS: 320.0000 mg | ORAL_TABLET | Freq: Every day | ORAL | 1 refills | Status: DC

## 2024-01-17 MED ORDER — SILDENAFIL CITRATE 20 MG PO TABS: ORAL_TABLET | ORAL | 5 refills | Status: DC

## 2024-01-17 MED ORDER — HYDROCHLOROTHIAZIDE 25 MG PO TABS: 25.0000 mg | ORAL_TABLET | Freq: Every day | ORAL | 1 refills | Status: DC

## 2024-01-17 NOTE — Progress Notes (Signed)
 Subjective:  Patient ID: Timothy Small, male    DOB: [birth year]  Age: 53 y.o. MRN: [id number]  Chief Complaint  Patient presents with   Annual Exam    HPI Well Adult Physical: Patient here for a comprehensive physical exam.The patient reports he has been having issues with ringing in  his ears for several months - also at times has issues with hearing  Do you take any herbs or supplements that were not prescribed by a doctor? no Are you taking calcium  supplements? no Are you taking aspirin daily? no  Encounter for general adult medical examination without abnormal findings  Physical ("At Risk" items are starred): Patient's last physical exam was 1 year ago .  Patient is not afflicted from Stress Incontinence and Urge Incontinence  Patient wears a seat belt, has smoke detectors, has carbon monoxide detectors, practices appropriate gun safety, and wears sunscreen with extended sun exposure. Dental Care: brushes and flosses daily. Last dental visit: up to date Vision impairments: none Ophthalmology/Optometry: is due for visit Hearing loss: yes - see above Last PSA: 12/24 - normal Lab Results  Component Value Date   PSA1 1.5 07/15/2023   Self Testicular Exam: Yes     01/17/2024   10:13 AM 01/17/2024    9:51 AM 07/15/2023   10:05 AM 10/03/2022   11:49 AM 03/28/2022    7:33 AM  Depression screen PHQ 2/9  Decreased Interest 0 0 0  0  Down, Depressed, Hopeless 0 0 0 0 0  PHQ - 2 Score 0 0 0 0 0  Altered sleeping 0 0 0    Tired, decreased energy 0 0 0    Change in appetite 0 0 0    Feeling bad or failure about yourself  0 0 0    Trouble concentrating 0 0 0    Moving slowly or fidgety/restless 0 0 0    Suicidal thoughts 0 0 0    PHQ-9 Score 0 0 0    Difficult doing work/chores Not difficult at all Not difficult at all Not difficult at all           03/28/2022    7:33 AM 10/03/2022   11:49 AM 07/15/2023   10:05 AM 01/17/2024    9:50 AM 01/17/2024   10:13 AM  Fall Risk  Falls in  the past year? 0 0 0 0 0  Was there an injury with Fall? 0 0 0 0 0  Fall Risk Category Calculator 0 0 0 0 0  Fall Risk Category (Retired) Low      (RETIRED) Patient Fall Risk Level Low fall risk      Patient at Risk for Falls Due to No Fall Risks  No Fall Risks No Fall Risks No Fall Risks  Fall risk Follow up Falls evaluation completed  Falls evaluation completed Falls evaluation completed Falls evaluation completed             Social Hx   Social History   Socioeconomic History   Marital status: Married    Spouse name: [name]   Number of children: 2   Years of education: Not on file   Highest education level: 12th grade  Occupational History    Employer: [employer]  Tobacco Use   Smoking status: Never   Smokeless tobacco: Former    Types: Snuff    Quit date: 08/09/2023   Tobacco comments:    1 can every two days  Vaping Use   Vaping status: Never  Used  Substance and Sexual Activity   Alcohol use: Yes    Alcohol/week: 4.0 standard drinks of alcohol    Types: 4 Cans of beer per week    Comment: socially   Drug use: No   Sexual activity: Yes    Partners: Female  Other Topics Concern   Not on file  Social History Narrative   Not on file   Social Drivers of Health   Financial Resource Strain: Low Risk  (01/16/2024)   Overall Financial Resource Strain (CARDIA)    Difficulty of Paying Living Expenses: Not hard at all  Food Insecurity: No Food Insecurity (01/16/2024)   Hunger Vital Sign    Worried About Running Out of Food in the Last Year: Never true    Ran Out of Food in the Last Year: Never true  Transportation Needs: No Transportation Needs (01/16/2024)   PRAPARE - Administrator, Civil Service (Medical): No    Lack of Transportation (Non-Medical): No  Physical Activity: Inactive (01/16/2024)   Exercise Vital Sign    Days of Exercise per Week: 0 days    Minutes of Exercise per Session: 30 min  Stress: No Stress Concern Present (01/16/2024)   [employer]  of Occupational Health - Occupational Stress Questionnaire    Feeling of Stress : Only a little  Social Connections: Socially Integrated (01/16/2024)   Social Connection and Isolation Panel [NHANES]    Frequency of Communication with Friends and Family: More than three times a week    Frequency of Social Gatherings with Friends and Family: More than three times a week    Attends Religious Services: More than 4 times per year    Active Member of Clubs or Organizations: Yes    Attends Banker Meetings: More than 4 times per year    Marital Status: Married   Past Medical History:  Diagnosis Date   BMI 34.0-34.9,adult 11/17/2020   BMI 35.0-35.9,adult    BRONCHIECTASIS W/O ACUTE EXACERBATION 09/14/2009   Qualifier: Diagnosis of  By: Waymond Hailey MD, Michael B    Chest pain 11/17/2020   Chronic obstructive pulmonary disease, unspecified COPD type (HCC) 09/19/2020   Dizziness 11/17/2020   ED (erectile dysfunction)    Erectile dysfunction 08/14/2015   Essential hypertension 09/14/2009   Qualifier: Diagnosis of  By: Cala Castleman     HLD (hyperlipidemia) 07/21/2015   Hypertension    Metabolic syndrome 07/21/2015   Multiple atypical nevi 09/13/2016   Nephrolithiasis 12/16/2015   OSA (obstructive sleep apnea)    s/p  osa surgery 2011-- sleep study repeated after surgery per pt stated mild osa no cpap   Palpitations 11/17/2020   Renal cyst 08/13/2008   multiple   Right ureteral stone    Routine general medical examination at a health care facility 09/13/2016   Shingles 11/01/2020   Shortness of breath 11/17/2020   Syncope 11/01/2020   Syncope and collapse 11/17/2020   Past Surgical History:  Procedure Laterality Date   CYSTOSCOPY/RETROGRADE/URETEROSCOPY/STONE EXTRACTION WITH BASKET Right 12/23/2015   Procedure: CYSTOSCOPY/RIGHT RETROGRADE/ PYELOGRAM URETEROSCOPY/STONE EXTRACTION WITH BASKET;  Surgeon: Mark Ottelin, MD;  Location: [facility];  Service: Urology;  Laterality: Right;    UVULOPALATOPHARYNGOPLASTY  2011   and Tonsillectomy/ Adenoidectomy for sleep apnea    Family History  Problem Relation Age of Onset   Cancer Father        bladder Ca    Heart disease Maternal Aunt 45       heart failure  Heart disease Maternal Uncle    Heart disease Maternal Grandmother    Heart disease Maternal Grandfather    Hypertension Brother     ROS CONSTITUTIONAL: Negative for chills, fatigue, fever, unintentional weight gain and unintentional weight loss.  E/N/T: see HPI CARDIOVASCULAR: Negative for chest pain, dizziness, palpitations and pedal edema.  RESPIRATORY: Negative for recent cough and dyspnea.  GASTROINTESTINAL: Negative for abdominal pain, acid reflux symptoms, constipation, diarrhea, nausea and vomiting.  MSK: Negative for arthralgias and myalgias.  INTEGUMENTARY: Negative for rash.  NEUROLOGICAL: Negative for dizziness and headaches.  PSYCHIATRIC: Negative for sleep disturbance and to question depression screen.  Negative for depression, negative for anhedonia.   Objective:  PHYSICAL EXAM:   BP 120/80   Pulse 73   Temp (!) 97.4 F (36.3 C)   Resp 16   Ht 5\' 11"  (1.803 m)   Wt 241 lb (109.3 kg)   SpO2 99%   BMI 33.61 kg/m  BP Readings from Last 3 Encounters:  01/17/24 120/80  09/09/23 138/64  08/19/23 (!) 148/94   Vision Screening   Right eye Left eye Both eyes  Without correction 20/20 20/35 20/20   With correction          GEN: Well nourished, well developed, in no acute distress  HEENT: normal external ears and nose - normal external auditory canals and TMS - hearing grossly normal - Lips, Teeth and Gums - normal  Oropharynx - normal mucosa, palate, and posterior pharynx Neck: no JVD or masses - no thyromegaly Cardiac: RRR; no murmurs, rubs, or gallops,no edema - no significant varicosities Respiratory:  normal respiratory rate and pattern with no distress - normal breath sounds with no rales, rhonchi, wheezes or rubs GI: normal bowel  sounds, no masses or tenderness MS: no deformity or atrophy  Skin: warm and dry, no rash  Neuro:  Alert and Oriented x 3, Strength and sensation are intact - CN II-Xii grossly intact Psych: euthymic mood, appropriate affect and demeanor  Office Visit on 01/17/2024  Component Date Value Ref Range Status   Color, UA 01/17/2024 yellow  yellow Final   Clarity, UA 01/17/2024 clear  clear Final   Glucose, UA 01/17/2024 negative  negative mg/dL Final   Bilirubin, UA 40/98/1191 negative  negative Final   Ketones, POC UA 01/17/2024 negative  negative mg/dL Final   Spec Grav, UA 47/82/9562 1.015  1.010 - 1.025 Final   Blood, UA 01/17/2024 negative  negative Final   pH, UA 01/17/2024 6.5  5.0 - 8.0 Final   POC PROTEIN,UA 01/17/2024 negative  negative, trace Final   Urobilinogen, UA 01/17/2024 0.2  0.2 or 1.0 E.U./dL Final   Nitrite, UA 13/03/6577 Negative  Negative Final   Leukocytes, UA 01/17/2024 Negative  Negative Final     Assessment & Plan:  Annual physical exam -     POCT URINALYSIS DIP (CLINITEK) -     CBC with Differential/Platelet -     Comprehensive metabolic panel with GFR -     TSH -     Lipid panel -     Hemoglobin A1c  Essential hypertension -     POCT URINALYSIS DIP (CLINITEK) -     amLODIPine  Besylate; Take 1 tablet (10 mg total) by mouth daily.  Dispense: 90 tablet; Refill: 1 -     hydroCHLOROthiazide ; Take 1 tablet (25 mg total) by mouth daily.  Dispense: 90 tablet; Refill: 1 -     Valsartan ; Take 1 tablet (320 mg total) by mouth daily.  Dispense: 90 tablet; Refill: 1  Hyperglycemia -     Hemoglobin A1c  Tinnitus of both ears -     Ambulatory referral to ENT  Colon cancer screening -     Ambulatory referral to Gastroenterology  Mixed hyperlipidemia -     Atorvastatin  Calcium ; Take 1 tablet (40 mg total) by mouth daily.  Dispense: 90 tablet; Refill: 1  Drug-induced erectile dysfunction -     Sildenafil  Citrate; 1 po qd prn  Dispense: 30 tablet; Refill:  5    This is a list of the screening recommended for you and due dates:  Health Maintenance  Topic Date Due   Colon Cancer Screening  Never done   Zoster (Shingles) Vaccine (1 of 2) 04/18/2024*   DTaP/Tdap/Td vaccine (2 - Td or Tdap) 07/14/2024*   Pneumococcal Vaccination (1 of 2 - PCV) 01/16/2025*   Flu Shot  03/13/2024   Hepatitis C Screening  Completed   HIV Screening  Completed   HPV Vaccine  Aged Out   Meningitis B Vaccine  Aged Out   COVID-19 Vaccine  Discontinued  *Topic was postponed. The date shown is not the original due date.     Follow-up: Return in about 6 months (around 07/18/2024) for chronic fasting follow-up.  An After Visit Summary was printed and given to the patient.  Anthonette Bastos [facility] [phone]

## 2024-01-18 LAB — COMPREHENSIVE METABOLIC PANEL WITH GFR
ALT: 32 IU/L (ref 0–44)
AST: 15 IU/L (ref 0–40)
Albumin: 4.8 g/dL (ref 3.8–4.9)
Alkaline Phosphatase: 71 IU/L (ref 44–121)
BUN/Creatinine Ratio: 13 (ref 9–20)
BUN: 14 mg/dL (ref 6–24)
Bilirubin Total: 0.6 mg/dL (ref 0.0–1.2)
CO2: 23 mmol/L (ref 20–29)
Calcium: 9.6 mg/dL (ref 8.7–10.2)
Chloride: 100 mmol/L (ref 96–106)
Creatinine, Ser: 1.04 mg/dL (ref 0.76–1.27)
Globulin, Total: 2.1 g/dL (ref 1.5–4.5)
Glucose: 98 mg/dL (ref 70–99)
Potassium: 4 mmol/L (ref 3.5–5.2)
Sodium: 140 mmol/L (ref 134–144)
Total Protein: 6.9 g/dL (ref 6.0–8.5)
eGFR: 86 mL/min/{1.73_m2} (ref >59)

## 2024-01-18 LAB — CBC WITH DIFFERENTIAL/PLATELET
Basophils Absolute: 0 10*3/uL (ref 0.0–0.2)
Basos: 1 %
EOS (ABSOLUTE): 0.1 10*3/uL (ref 0.0–0.4)
Eos: 1 %
Hematocrit: 48.4 % (ref 37.5–51.0)
Hemoglobin: 17.3 g/dL (ref 13.0–17.7)
Immature Grans (Abs): 0 10*3/uL (ref 0.0–0.1)
Immature Granulocytes: 0 %
Lymphocytes Absolute: 1 10*3/uL (ref 0.7–3.1)
Lymphs: 20 %
MCH: 31.7 pg (ref 26.6–33.0)
MCHC: 35.7 g/dL (ref 31.5–35.7)
MCV: 89 fL (ref 79–97)
Monocytes Absolute: 0.5 10*3/uL (ref 0.1–0.9)
Monocytes: 11 %
Neutrophils Absolute: 3.2 10*3/uL (ref 1.4–7.0)
Neutrophils: 67 %
Platelets: 189 10*3/uL (ref 150–450)
RBC: 5.46 x10E6/uL (ref 4.14–5.80)
RDW: 12.3 % (ref 11.6–15.4)
WBC: 4.9 10*3/uL (ref 3.4–10.8)

## 2024-01-18 LAB — LIPID PANEL
Chol/HDL Ratio: 3.6 ratio (ref 0.0–5.0)
Cholesterol, Total: 142 mg/dL (ref 100–199)
HDL: 39 mg/dL — ABNORMAL LOW (ref >39)
LDL Chol Calc (NIH): 75 mg/dL (ref 0–99)
Triglycerides: 165 mg/dL — ABNORMAL HIGH (ref 0–149)
VLDL Cholesterol Cal: 28 mg/dL (ref 5–40)

## 2024-01-18 LAB — TSH: TSH: 3.2 u[IU]/mL (ref 0.450–4.500)

## 2024-01-18 LAB — HEMOGLOBIN A1C
Est. average glucose Bld gHb Est-mCnc: 108 mg/dL
Hgb A1c MFr Bld: 5.4 % (ref 4.8–5.6)

## 2024-01-19 ENCOUNTER — Other Ambulatory Visit: Payer: Self-pay | Admitting: Physician Assistant

## 2024-01-19 DIAGNOSIS — N522 Drug-induced erectile dysfunction: Secondary | ICD-10-CM

## 2024-01-20 ENCOUNTER — Encounter: Payer: Commercial Managed Care - PPO | Admitting: Physician Assistant

## 2024-01-20 ENCOUNTER — Ambulatory Visit: Payer: Self-pay | Admitting: Physician Assistant

## 2024-04-02 ENCOUNTER — Ambulatory Visit (INDEPENDENT_AMBULATORY_CARE_PROVIDER_SITE_OTHER): Admitting: Audiology

## 2024-04-02 ENCOUNTER — Ambulatory Visit (INDEPENDENT_AMBULATORY_CARE_PROVIDER_SITE_OTHER): Admitting: Otolaryngology

## 2024-04-02 ENCOUNTER — Encounter (INDEPENDENT_AMBULATORY_CARE_PROVIDER_SITE_OTHER): Payer: Self-pay | Admitting: Otolaryngology

## 2024-04-02 VITALS — BP 159/98 | HR 60 | Ht 71.0 in | Wt 245.0 lb

## 2024-04-02 DIAGNOSIS — H903 Sensorineural hearing loss, bilateral: Secondary | ICD-10-CM | POA: Diagnosis not present

## 2024-04-02 DIAGNOSIS — H905 Unspecified sensorineural hearing loss: Secondary | ICD-10-CM

## 2024-04-02 DIAGNOSIS — H9313 Tinnitus, bilateral: Secondary | ICD-10-CM | POA: Diagnosis not present

## 2024-04-02 NOTE — Progress Notes (Signed)
 Dear Dr. Nicholaus, Here is my assessment for our mutual patient, Timothy Small. Thank you for allowing me the opportunity to care for your patient. Please do not hesitate to contact me should you have any other questions. Sincerely, Dr. Eldora Blanch  Otolaryngology Clinic Note Referring provider: Dr. Nicholaus HPI:  Timothy Small is a 53 y.o. male kindly referred by Dr. Nicholaus for evaluation of bilateral tinnitus  Initial visit (03/2024): Patient reports: noted bilateral constant tinnitus, high pitch, worsening.. Denies antecedent URI, neck pain, trauma, loud noise exposure. He does report that he has had a hearing loss in his left ear for a while. No sudden hearing change around tinnitus onset. No facial numbness or weakness or headaches. He has poor sleep quality but does not seem to prevent from sleeping. Notices it most in a quiet room. Has not tried anything. Nothing seems to make it better. No ASA use.  Patient denies: ear pain, fullness, vertigo, drainage Patient additionally denies: deep pain in ear canal, eustachian tube symptoms such as popping/crackling, sensitive to pressure changes Patient also denies barotrauma, vestibular suppressant use, ototoxic medication use Prior ear surgery: no No history of ear issues otherwise.  ENT Surgery: UPPP Personal or FHx of bleeding dz or anesthesia difficulty: no  AP/AC: no  Tobacco: no  PMHx: HTN, OSA, Nephrolithiasis, HLD  Independent Review of Additional Tests or Records:  03/2024 Audiogram was independently reviewed and interpreted by me and it reveals - AD/AS: A/A tymps; WRT 95%/80% at 80dB AD/AS; clear asymmetry Right ear- Normal hearing from [phone], them mild to moderate sensorineural hearing loss from 3000 Hz - 8000 Hz. Left ear-  Normal hearing from [phone] Hz, then moderately severe to severe sensorineural hearing loss from 2000 Hz - 8000 Hz. Hearing asymmetry noted, worse in the left ear from 2k-8k Hz. SNHL= Sensorineural  hearing loss  CBC and CMP 01/17/2024: WBC 4.9, BUN/Cr 14/1.04  Timothy Small (01/17/2024): noted bilateral tinnitus, ongoing since about Feb 2025 and at times issues with hearing; no supplements; no ASA; Dx: Tinnitus; Rx: ref to ENT  PMH/Meds/All/SocHx/FamHx/ROS:   Past Medical History:  Diagnosis Date   BMI 34.0-34.9,adult 11/17/2020   BMI 35.0-35.9,adult    BRONCHIECTASIS W/O ACUTE EXACERBATION 09/14/2009   Qualifier: Diagnosis of  By: Darlean MD, Ozell NOVAK    Chest pain 11/17/2020   Chronic obstructive pulmonary disease, unspecified COPD type (HCC) 09/19/2020   Dizziness 11/17/2020   ED (erectile dysfunction)    Erectile dysfunction 08/14/2015   Essential hypertension 09/14/2009   Qualifier: Diagnosis of  By: Winona Raring     HLD (hyperlipidemia) 07/21/2015   Hypertension    Metabolic syndrome 07/21/2015   Multiple atypical nevi 09/13/2016   Nephrolithiasis 12/16/2015   OSA (obstructive sleep apnea)    s/p  osa surgery 2011-- sleep study repeated after surgery per pt stated mild osa no cpap   Palpitations 11/17/2020   Renal cyst 08/13/2008   multiple   Right ureteral stone    Routine general medical examination at a health care facility 09/13/2016   Shingles 11/01/2020   Shortness of breath 11/17/2020   Syncope 11/01/2020   Syncope and collapse 11/17/2020     Past Surgical History:  Procedure Laterality Date   CYSTOSCOPY/RETROGRADE/URETEROSCOPY/STONE EXTRACTION WITH BASKET Right 12/23/2015   Procedure: CYSTOSCOPY/RIGHT RETROGRADE/ PYELOGRAM URETEROSCOPY/STONE EXTRACTION WITH BASKET;  Surgeon: Mark Ottelin, MD;  [facility];  Service: Urology;  Laterality: Right;   UVULOPALATOPHARYNGOPLASTY  2011   and Tonsillectomy/ Adenoidectomy for sleep apnea  Family History  Problem Relation Age of Onset   Cancer Father        bladder Ca    Heart disease Maternal Aunt 45       heart failure   Heart disease Maternal Uncle    Heart disease Maternal Grandmother    Heart disease Maternal  Grandfather    Hypertension Brother      Social Connections: Socially Integrated (01/16/2024)   Social Connection and Isolation Panel    Frequency of Communication with Friends and Family: More than three times a week    Frequency of Social Gatherings with Friends and Family: More than three times a week    Attends Religious Services: More than 4 times per year    Active Member of [employer] or Organizations: Yes    Attends Engineer, structural: More than 4 times per year    Marital Status: Married      Current Outpatient Medications:    amLODipine  (NORVASC ) 10 MG tablet, Take 1 tablet (10 mg total) by mouth daily., Disp: 90 tablet, Rfl: 1   atorvastatin  (LIPITOR) 40 MG tablet, Take 1 tablet (40 mg total) by mouth daily., Disp: 90 tablet, Rfl: 1   hydrochlorothiazide  (HYDRODIURIL ) 25 MG tablet, Take 1 tablet (25 mg total) by mouth daily., Disp: 90 tablet, Rfl: 1   sildenafil  (REVATIO ) 20 MG tablet, TAKE 1 TABLET BY MOUTH EVERY DAY AS NEEDED, Disp: 30 tablet, Rfl: 5   valsartan  (DIOVAN ) 320 MG tablet, Take 1 tablet (320 mg total) by mouth daily., Disp: 90 tablet, Rfl: 1   Physical Exam:   BP (!) 159/98 (BP Location: Right Arm, Patient Position: Sitting, Cuff Size: Large)   Pulse 60   Ht 5' 11 (1.803 m)   Wt 245 lb (111.1 kg)   SpO2 95%   BMI 34.17 kg/m   Salient findings:  CN II-XII intact Given history and complaints, ear microscopy was indicated and performed for evaluation with findings as below in physical exam section and in procedures Bilateral EAC clear and TM intact with well pneumatized middle ear spaces Weber 512: mid Rinne 512: AC > BC b/l  Anterior rhinoscopy: Septum intact; bilateral inferior turbinates without significant hypertrophy No lesions of oral cavity/oropharynx; evidence of prior UPPP No obviously palpable neck masses/lymphadenopathy/thyromegaly No respiratory distress or stridor  Seprately Identifiable Procedures:  Prior to initiating any  procedures, risks/benefits/alternatives were explained to the patient and verbal consent obtained. Procedure: Bilateral ear microscopy using microscope (CPT [id number]) Pre-procedure diagnosis: tinnitus, bilateral Post-procedure diagnosis: same Indication: see above; given patient's otologic complaints and history, for improved and comprehensive examination of external ear and tympanic membrane, bilateral otologic examination using microscope was performed. Prior to proceeding, verbal consent was obtained after discussion of R/B/A  Procedure: Patient was placed semi-recumbent. Both ear canals were examined using the microscope with findings above. Patient tolerated the procedure well.   Impression & Plans:  Timothy Small is a 53 y.o. male with:  1. Tinnitus of both ears   2. Asymmetrical sensorineural hearing loss    Noted b/l tinnitus - non-pulsatile; we discussed etiologies and management - observation, amplification, flavonoid trial, and retraining; he opted for #2, 3 We also discussed his hearing asymmetry - would rec MRI to r/o retrocochlear lesion; he is agreeable  F/u 3 months with MRI; will provide with HA resource sheet   See below regarding exact medications prescribed this encounter including dosages and route: No orders of the defined types were placed in this encounter.  Thank you for allowing me the opportunity to care for your patient. Please do not hesitate to contact me should you have any other questions.  Sincerely, Eldora Blanch, MD Otolaryngologist (ENT), [facility] Phone: [phone] Fax: [phone]  04/04/2024, 11:06 AM   MDM:  Level 4 - [phone] Complexity/Problems addressed: mod - multiple chronic problems, persistent Data complexity: mod - independent review of notes, labs, ordering test - Timothy: low - Prescription Drug prescribed or Small: no

## 2024-04-02 NOTE — Patient Instructions (Addendum)
 Lipoflavonoids   I have ordered an imaging study for you to complete prior to your next visit. Please call [facility] Scheduling at [phone] to schedule your imaging if you have not received a call within 24 hours. If you are unable to complete your imaging study prior to your next scheduled visit please call our office to let us know.

## 2024-04-02 NOTE — Progress Notes (Signed)
  [address], [address] [place], [place] [address] [phone]  Audiological Evaluation    Name: Timothy Small     DOB:   [birth year]      MRN:   [id number]                                                                                     Service Date: 04/02/2024     Accompanied by: unaccompanied   Patient comes today after Dr. Tobie, ENT sent a referral for a hearing evaluation due to concerns with tinnitus.   Symptoms Yes Details  Hearing loss  []    Tinnitus  [x]  Both ears since January 2025  Ear pain/ infections/pressure  []    Balance problems  []    Noise exposure history  []    Previous ear surgeries  []    Family history of hearing loss  []    Amplification  []    Other  []      Otoscopy: Right ear: Clear external ear canal and notable landmarks visualized on the tympanic membrane. Left ear:  Clear external ear canal and notable landmarks visualized on the tympanic membrane.  Tympanometry: Right ear: Type A- Normal external ear canal volume with normal middle ear pressure and tympanic membrane compliance. Left ear: Type A- Normal external ear canal volume with normal middle ear pressure and tympanic membrane compliance.  Pure tone Audiometry: Right ear- Normal hearing from [phone] Hz, them mild to moderate sensorineural hearing loss from 3000 Hz - 8000 Hz. Left ear-  Normal hearing from [phone] Hz, then moderately severe to severe sensorineural hearing loss from 2000 Hz - 8000 Hz. Hearing asymmetry noted, worse in the left ear from 2k-8k Hz.  Speech Audiometry: Right ear- Speech Reception Threshold (SRT) was obtained at 20 dBHL. Left ear-Speech Reception Threshold (SRT) was obtained at 20 dBHL.   Word Recognition Score Tested using NU-6 (recorded) Right ear: 96% was obtained at a presentation level of 70 dBHL with contralateral masking which is deemed as  excellent. Left ear: 80% was obtained at a presentation level of 70 dBHL with contralateral masking  which is deemed as  good .   The hearing test results were completed under headphones and re-checked with inserts and results are deemed to be of good to fair reliability. Test technique:  conventional      Recommendations: Follow up with ENT as scheduled for today. Return for a hearing evaluation if concerns with hearing changes arise or per MD recommendation. Consider various tinnitus strategies, including the use of a sound generator, hearing aids, and/or tinnitus retraining therapy.  Consider a communication needs assessment after medical clearance for hearing aids is obtained.   Shaterrica Territo MARIE LEROUX-MARTINEZ, AUD

## 2024-04-15 ENCOUNTER — Ambulatory Visit (HOSPITAL_COMMUNITY)
Admission: RE | Admit: 2024-04-15 | Discharge: 2024-04-15 | Disposition: A | Discharge status: Home / Self Care | Source: Ambulatory Visit | Attending: Otolaryngology | Admitting: Otolaryngology

## 2024-04-15 DIAGNOSIS — H903 Sensorineural hearing loss, bilateral: Secondary | ICD-10-CM | POA: Diagnosis present

## 2024-04-15 MED ORDER — GADOBUTROL 1 MMOL/ML IV SOLN
10.0000 mL | Freq: Once | INTRAVENOUS | Status: AC | PRN
  Administered 2024-04-15: 10 mL via INTRAVENOUS

## 2024-06-01 ENCOUNTER — Telehealth: Payer: Self-pay | Admitting: Physician Assistant

## 2024-06-01 NOTE — Telephone Encounter (Signed)
 Called and left a voicemail for patient to call the office to reschedule the appointment from 07/24/24 due to the provider being out of the office.

## 2024-06-19 ENCOUNTER — Ambulatory Visit: Payer: Self-pay | Admitting: *Deleted

## 2024-06-19 NOTE — Telephone Encounter (Signed)
 FYI Only or Action Required?: FYI only for provider: appointment scheduled on 11/11.  Patient was last seen in primary care on 01/17/2024 by Nicholaus Credit, PA-C.  Called Nurse Triage reporting Back Pain.  Symptoms began several weeks ago.  Interventions attempted: Nothing.  Symptoms are: gradually worsening.  Triage Disposition: See PCP When Office is Open (Within 3 Days)  Patient/caregiver understands and will follow disposition?: Yes  Patient is working out of town- appointment scheduled for Monday- advised UC for increased/changed symptoms  Copied from CRM #[id number]. Topic: Clinical - Red Word Triage >> Jun 19, 2024  9:13 AM Antwanette L wrote: Red Word that prompted transfer to Nurse Triage: Possible kidney stone.Patient reports experiencing sharp pain in the back, side, and bladder/groin area. Symptoms have persisted for approximately a week and a half. Reason for Disposition  [1] MODERATE back pain (e.g., interferes with normal activities) AND [2] present > 3 days  Answer Assessment - Initial Assessment Questions 1. ONSET: When did the pain begin? (e.g., minutes, hours, days)     1 1/2 weeks 2. LOCATION: Where does it hurt? (upper, mid or lower back)     R back and side 3. SEVERITY: How bad is the pain?  (e.g., Scale 1-10; mild, moderate, or severe)     2/10, movement gets worse 4. PATTERN: Is the pain constant? (e.g., yes, no; constant, intermittent)       constant 5. RADIATION: Does the pain shoot into your legs or somewhere else?     Radiating into side/groin 6. CAUSE:  What do you think is causing the back pain?      Hx kidney stone 7. BACK OVERUSE:  Any recent lifting of heavy objects, strenuous work or exercise?     no 8. MEDICINES: What have you taken so far for the pain? (e.g., nothing, acetaminophen , NSAIDS)     Tylenol  9. NEUROLOGIC SYMPTOMS: Do you have any weakness, numbness, or problems with bowel/bladder control?     no 10. OTHER SYMPTOMS:  Do you have any other symptoms? (e.g., fever, abdomen pain, burning with urination, blood in urine)       Abdominal pain  Protocols used: Back Pain-A-AH

## 2024-06-22 ENCOUNTER — Ambulatory Visit: Admitting: Family Medicine

## 2024-07-06 ENCOUNTER — Ambulatory Visit (INDEPENDENT_AMBULATORY_CARE_PROVIDER_SITE_OTHER): Admitting: Otolaryngology

## 2024-07-23 ENCOUNTER — Other Ambulatory Visit: Payer: Self-pay | Admitting: Physician Assistant

## 2024-07-23 DIAGNOSIS — I10 Essential (primary) hypertension: Secondary | ICD-10-CM

## 2024-07-23 DIAGNOSIS — E782 Mixed hyperlipidemia: Secondary | ICD-10-CM

## 2024-07-24 ENCOUNTER — Ambulatory Visit: Admitting: Physician Assistant

## 2024-08-03 ENCOUNTER — Encounter: Payer: Self-pay | Admitting: Physician Assistant

## 2024-08-03 ENCOUNTER — Ambulatory Visit: Admitting: Physician Assistant

## 2024-08-03 VITALS — BP 142/80 | HR 68 | Temp 97.5°F | Ht 71.0 in | Wt 250.4 lb

## 2024-08-03 DIAGNOSIS — I10 Essential (primary) hypertension: Secondary | ICD-10-CM

## 2024-08-03 DIAGNOSIS — R739 Hyperglycemia, unspecified: Secondary | ICD-10-CM | POA: Diagnosis not present

## 2024-08-03 DIAGNOSIS — E782 Mixed hyperlipidemia: Secondary | ICD-10-CM | POA: Diagnosis not present

## 2024-08-03 DIAGNOSIS — Z125 Encounter for screening for malignant neoplasm of prostate: Secondary | ICD-10-CM

## 2024-08-03 DIAGNOSIS — N522 Drug-induced erectile dysfunction: Secondary | ICD-10-CM | POA: Diagnosis not present

## 2024-08-03 LAB — CBC WITH DIFFERENTIAL/PLATELET
Basophils Absolute: 0 x10E3/uL (ref 0.0–0.2)
Basos: 1 %
EOS (ABSOLUTE): 0.1 x10E3/uL (ref 0.0–0.4)
Eos: 1 %
Hematocrit: 48 % (ref 37.5–51.0)
Hemoglobin: 16.9 g/dL (ref 13.0–17.7)
Immature Grans (Abs): 0 x10E3/uL (ref 0.0–0.1)
Immature Granulocytes: 0 %
Lymphocytes Absolute: 1 x10E3/uL (ref 0.7–3.1)
Lymphs: 20 %
MCH: 30.7 pg (ref 26.6–33.0)
MCHC: 35.2 g/dL (ref 31.5–35.7)
MCV: 87 fL (ref 79–97)
Monocytes Absolute: 0.5 x10E3/uL (ref 0.1–0.9)
Monocytes: 10 %
Neutrophils Absolute: 3.3 x10E3/uL (ref 1.4–7.0)
Neutrophils: 68 %
Platelets: 198 x10E3/uL (ref 150–450)
RBC: 5.5 x10E6/uL (ref 4.14–5.80)
RDW: 12.6 % (ref 11.6–15.4)
WBC: 4.9 x10E3/uL (ref 3.4–10.8)

## 2024-08-03 LAB — LIPID PANEL
Chol/HDL Ratio: 3.3 ratio (ref 0.0–5.0)
Cholesterol, Total: 138 mg/dL (ref 100–199)
HDL: 42 mg/dL
LDL Chol Calc (NIH): 72 mg/dL (ref 0–99)
Triglycerides: 138 mg/dL (ref 0–149)
VLDL Cholesterol Cal: 24 mg/dL (ref 5–40)

## 2024-08-03 LAB — COMPREHENSIVE METABOLIC PANEL WITH GFR
ALT: 32 IU/L (ref 0–44)
AST: 17 IU/L (ref 0–40)
Albumin: 4.6 g/dL (ref 3.8–4.9)
Alkaline Phosphatase: 80 IU/L (ref 47–123)
BUN/Creatinine Ratio: 11 (ref 9–20)
BUN: 11 mg/dL (ref 6–24)
Bilirubin Total: 0.5 mg/dL (ref 0.0–1.2)
CO2: 24 mmol/L (ref 20–29)
Calcium: 9.6 mg/dL (ref 8.7–10.2)
Chloride: 100 mmol/L (ref 96–106)
Creatinine, Ser: 0.97 mg/dL (ref 0.76–1.27)
Globulin, Total: 1.8 g/dL (ref 1.5–4.5)
Glucose: 95 mg/dL (ref 70–99)
Potassium: 3.7 mmol/L (ref 3.5–5.2)
Sodium: 138 mmol/L (ref 134–144)
Total Protein: 6.4 g/dL (ref 6.0–8.5)
eGFR: 93 mL/min/1.73

## 2024-08-03 LAB — PSA: Prostate Specific Ag, Serum: 1.1 ng/mL (ref 0.0–4.0)

## 2024-08-03 LAB — HEMOGLOBIN A1C
Est. average glucose Bld gHb Est-mCnc: 108 mg/dL
Hgb A1c MFr Bld: 5.4 % (ref 4.8–5.6)

## 2024-08-03 LAB — TSH: TSH: 2.9 u[IU]/mL (ref 0.450–4.500)

## 2024-08-03 MED ORDER — HYDROCHLOROTHIAZIDE 25 MG PO TABS: 25.0000 mg | ORAL_TABLET | Freq: Every day | ORAL | 5 refills | Status: AC

## 2024-08-03 MED ORDER — ATORVASTATIN CALCIUM 40 MG PO TABS: 40.0000 mg | ORAL_TABLET | Freq: Every day | ORAL | 5 refills | Status: AC

## 2024-08-03 MED ORDER — AMLODIPINE BESYLATE 10 MG PO TABS: 10.0000 mg | ORAL_TABLET | Freq: Every day | ORAL | 5 refills | Status: AC

## 2024-08-03 MED ORDER — SILDENAFIL CITRATE 20 MG PO TABS: 20.0000 mg | ORAL_TABLET | Freq: Every day | ORAL | 5 refills | Status: DC | PRN

## 2024-08-03 MED ORDER — VALSARTAN 320 MG PO TABS: 320.0000 mg | ORAL_TABLET | Freq: Every day | ORAL | 5 refills | Status: AC

## 2024-08-03 NOTE — Progress Notes (Signed)
 "  Subjective:  Patient ID: Timothy Small, male    DOB: 03-03-1971  Age: 53 y.o. MRN: [id number]  Chief Complaint  Patient presents with   Medical Management of Chronic Issues    HPI Pt presents for follow up of hypertension. The patient is tolerating the medication well without side effects. Compliance with treatment has been good; including taking medication as directed , maintains a healthy diet and regular exercise regimen , and following up as directed. He states bp has been doing well at home with readings 130/-140/80s Denies chest pain/sob/edema Current meds include norvasc  10mg , hctz 25mg  and diovan  320mg   Mixed hyperlipidemia  Pt presents with hyperlipidemia. Compliance with treatment has been good - The patient is compliant with medications, maintains a low cholesterol diet , follows up as directed , and maintains an exercise regimen . The patient denies experiencing any hypercholesterolemia related symptoms. Takes lipitor 40mg  qd  Pt with erectile dysfunction - uses revatio  prn - requests refill  Pt has had elevated glucose with past labwork - will recheck hgb a1c  Pt due to check PSA - no symptoms         08/03/2024   10:51 AM 01/17/2024   10:13 AM 01/17/2024    9:51 AM 07/15/2023   10:05 AM 10/03/2022   11:49 AM  Depression screen PHQ 2/9  Decreased Interest 0 0 0 0   Down, Depressed, Hopeless 0 0 0 0 0  PHQ - 2 Score 0 0 0 0 0  Altered sleeping  0 0 0   Tired, decreased energy  0 0 0   Change in appetite  0 0 0   Feeling bad or failure about yourself   0 0 0   Trouble concentrating  0 0 0   Moving slowly or fidgety/restless  0 0 0   Suicidal thoughts  0 0 0   PHQ-9 Score  0  0  0    Difficult doing work/chores  Not difficult at all Not difficult at all Not difficult at all      Data saved with a previous flowsheet row definition        10/03/2022   11:49 AM 07/15/2023   10:05 AM 01/17/2024    9:50 AM 01/17/2024   10:13 AM 08/03/2024   10:51 AM  Fall Risk   Falls in the past year? 0 0 0 0 0  Was there an injury with Fall? 0  0  0  0    Fall Risk Category Calculator 0 0 0 0   Patient at Risk for Falls Due to  No Fall Risks No Fall Risks No Fall Risks No Fall Risks  Fall risk Follow up  Falls evaluation completed Falls evaluation completed Falls evaluation completed Falls evaluation completed     Data saved with a previous flowsheet row definition    CONSTITUTIONAL: Negative for chills, fatigue, fever, unintentional weight gain and unintentional weight loss.  E/N/T: Negative for ear pain, nasal congestion and sore throat.  CARDIOVASCULAR: Negative for chest pain, dizziness, palpitations and pedal edema.  RESPIRATORY: Negative for recent cough and dyspnea.  GASTROINTESTINAL: Negative for abdominal pain, acid reflux symptoms, constipation, diarrhea, nausea and vomiting.  MSK: Negative for arthralgias and myalgias.  INTEGUMENTARY: Negative for rash.  NEUROLOGICAL: Negative for dizziness and headaches.  PSYCHIATRIC: Negative for sleep disturbance and to question depression screen.  Negative for depression, negative for anhedonia.       Current Outpatient Medications:    amLODipine  (NORVASC )  10 MG tablet, Take 1 tablet (10 mg total) by mouth daily., Disp: 30 tablet, Rfl: 5   atorvastatin  (LIPITOR) 40 MG tablet, Take 1 tablet (40 mg total) by mouth daily., Disp: 30 tablet, Rfl: 5   hydrochlorothiazide  (HYDRODIURIL ) 25 MG tablet, Take 1 tablet (25 mg total) by mouth daily., Disp: 30 tablet, Rfl: 5   sildenafil  (REVATIO ) 20 MG tablet, Take 1 tablet (20 mg total) by mouth daily as needed., Disp: 30 tablet, Rfl: 5   valsartan  (DIOVAN ) 320 MG tablet, Take 1 tablet (320 mg total) by mouth daily., Disp: 30 tablet, Rfl: 5  Past Medical History:  Diagnosis Date   BMI 34.0-34.9,adult 11/17/2020   BMI 35.0-35.9,adult    BRONCHIECTASIS W/O ACUTE EXACERBATION 09/14/2009   Qualifier: Diagnosis of  By: Darlean MD, Ozell NOVAK    Chest pain 11/17/2020   Chronic  obstructive pulmonary disease, unspecified COPD type (HCC) 09/19/2020   Dizziness 11/17/2020   ED (erectile dysfunction)    Erectile dysfunction 08/14/2015   Essential hypertension 09/14/2009   Qualifier: Diagnosis of  By: Winona Raring     HLD (hyperlipidemia) 07/21/2015   Hypertension    Metabolic syndrome 07/21/2015   Multiple atypical nevi 09/13/2016   Nephrolithiasis 12/16/2015   OSA (obstructive sleep apnea)    s/p  osa surgery 2011-- sleep study repeated after surgery per pt stated mild osa no cpap   Palpitations 11/17/2020   Renal cyst 08/13/2008   multiple   Right ureteral stone    Routine general medical examination at a health care facility 09/13/2016   Shingles 11/01/2020   Shortness of breath 11/17/2020   Syncope 11/01/2020   Syncope and collapse 11/17/2020   Objective:  PHYSICAL EXAM:   VS: BP (!) 142/80   Pulse 68   Temp (!) 97.5 F (36.4 C)   Ht 5' 11 (1.803 m)   Wt 250 lb 6.4 oz (113.6 kg)   SpO2 98%   BMI 34.92 kg/m   GEN: Well nourished, well developed, in no acute distress  Cardiac: RRR; no murmurs, rubs, or gallops,no edema -  Respiratory:  normal respiratory rate and pattern with no distress - normal breath sounds with no rales, rhonchi, wheezes or rubs MS: no deformity or atrophy  Skin: warm and dry, no rash  Neuro:  Alert and Oriented x 3, - CN II-Xii grossly intact Psych: euthymic mood, appropriate affect and demeanor   Assessment & Plan:    Essential hypertension -     CBC with Differential/Platelet -     Comprehensive metabolic panel -     TSH Continue meds Recheck bp one month Mixed hyperlipidemia -     Comprehensive metabolic panel -     Lipid panel -     Atorvastatin  Calcium ; Take 1 tablet (40 mg total) by mouth daily.  Dispense: 90 tablet; Refill: 1 Watch diet Hyperglycemia -     Hemoglobin A1c Watch diet Prostate cancer screening -     PSA   Drug-induced erectile dysfunction -     Sildenafil  Citrate; 1 po qd prn  Dispense: 30 tablet; Refill:  5       Follow-up: Return in about 6 months (around 02/01/2025) for fasting physical and murse visit bp check in one month.  An After Visit Summary was printed and given to the patient.  CAMIE JONELLE NICHOLAUS DEVONNA [facility] [phone] "

## 2024-08-04 ENCOUNTER — Ambulatory Visit: Payer: Self-pay | Admitting: Physician Assistant

## 2024-08-14 ENCOUNTER — Other Ambulatory Visit: Payer: Self-pay | Admitting: Physician Assistant

## 2024-08-14 DIAGNOSIS — N522 Drug-induced erectile dysfunction: Secondary | ICD-10-CM

## 2024-09-04 ENCOUNTER — Ambulatory Visit

## 2025-02-22 ENCOUNTER — Encounter: Admitting: Physician Assistant
